# Patient Record
Sex: Female | Born: 1980 | Hispanic: No | Marital: Married | Smoking: Former smoker
Health system: Southern US, Community
[De-identification: ages and names within clinical notes are randomized; demographics above are authoritative.]

## PROBLEM LIST (undated history)

## (undated) ENCOUNTER — Inpatient Hospital Stay (HOSPITAL_COMMUNITY): Payer: Self-pay

## (undated) DIAGNOSIS — M5442 Lumbago with sciatica, left side: Secondary | ICD-10-CM

## (undated) DIAGNOSIS — B999 Unspecified infectious disease: Secondary | ICD-10-CM

## (undated) DIAGNOSIS — O139 Gestational [pregnancy-induced] hypertension without significant proteinuria, unspecified trimester: Secondary | ICD-10-CM

## (undated) DIAGNOSIS — M5441 Lumbago with sciatica, right side: Secondary | ICD-10-CM

## (undated) DIAGNOSIS — K219 Gastro-esophageal reflux disease without esophagitis: Secondary | ICD-10-CM

## (undated) DIAGNOSIS — G8929 Other chronic pain: Secondary | ICD-10-CM

## (undated) DIAGNOSIS — M797 Fibromyalgia: Secondary | ICD-10-CM

## (undated) DIAGNOSIS — R51 Headache: Secondary | ICD-10-CM

## (undated) DIAGNOSIS — O009 Unspecified ectopic pregnancy without intrauterine pregnancy: Secondary | ICD-10-CM

## (undated) DIAGNOSIS — K30 Functional dyspepsia: Secondary | ICD-10-CM

## (undated) HISTORY — DX: Other chronic pain: G89.29

## (undated) HISTORY — DX: Lumbago with sciatica, right side: M54.41

## (undated) HISTORY — DX: Lumbago with sciatica, left side: M54.42

## (undated) HISTORY — PX: CARDIAC CATHETERIZATION: SHX172

## (undated) HISTORY — PX: THERAPEUTIC ABORTION: SHX798

---
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
## 1999-02-27 ENCOUNTER — Inpatient Hospital Stay (HOSPITAL_COMMUNITY): Admission: AD | Admit: 1999-02-27 | Discharge: 1999-02-27 | Payer: Self-pay | Admitting: Obstetrics

## 1999-02-28 ENCOUNTER — Inpatient Hospital Stay (HOSPITAL_COMMUNITY): Admission: AD | Admit: 1999-02-28 | Discharge: 1999-03-05 | Payer: Self-pay | Admitting: Obstetrics

## 2000-11-02 ENCOUNTER — Encounter: Payer: Self-pay | Admitting: Emergency Medicine

## 2000-11-02 ENCOUNTER — Emergency Department (HOSPITAL_COMMUNITY): Admission: EM | Admit: 2000-11-02 | Discharge: 2000-11-02 | Payer: Self-pay | Admitting: Emergency Medicine

## 2001-01-23 ENCOUNTER — Other Ambulatory Visit: Admission: RE | Admit: 2001-01-23 | Discharge: 2001-01-23 | Payer: Self-pay | Admitting: Obstetrics and Gynecology

## 2001-03-01 ENCOUNTER — Inpatient Hospital Stay (HOSPITAL_COMMUNITY): Admission: AD | Admit: 2001-03-01 | Discharge: 2001-03-01 | Payer: Self-pay | Admitting: Obstetrics and Gynecology

## 2001-03-01 ENCOUNTER — Encounter: Payer: Self-pay | Admitting: Obstetrics and Gynecology

## 2001-08-20 ENCOUNTER — Inpatient Hospital Stay (HOSPITAL_COMMUNITY): Admission: AD | Admit: 2001-08-20 | Discharge: 2001-08-20 | Payer: Self-pay | Admitting: Obstetrics and Gynecology

## 2001-08-21 ENCOUNTER — Inpatient Hospital Stay (HOSPITAL_COMMUNITY): Admission: AD | Admit: 2001-08-21 | Discharge: 2001-08-23 | Payer: Self-pay | Admitting: Obstetrics and Gynecology

## 2002-08-15 ENCOUNTER — Emergency Department (HOSPITAL_COMMUNITY): Admission: EM | Admit: 2002-08-15 | Discharge: 2002-08-15 | Payer: Self-pay

## 2002-12-08 ENCOUNTER — Emergency Department (HOSPITAL_COMMUNITY): Admission: EM | Admit: 2002-12-08 | Discharge: 2002-12-08 | Payer: Self-pay | Admitting: Emergency Medicine

## 2003-04-04 ENCOUNTER — Emergency Department (HOSPITAL_COMMUNITY): Admission: EM | Admit: 2003-04-04 | Discharge: 2003-04-04 | Payer: Self-pay | Admitting: Emergency Medicine

## 2004-01-24 ENCOUNTER — Emergency Department (HOSPITAL_COMMUNITY): Admission: EM | Admit: 2004-01-24 | Discharge: 2004-01-25 | Payer: Self-pay | Admitting: Emergency Medicine

## 2004-05-03 HISTORY — PX: CARDIAC CATHETERIZATION: SHX172

## 2004-05-28 ENCOUNTER — Ambulatory Visit: Admission: RE | Admit: 2004-05-28 | Discharge: 2004-05-28 | Payer: Self-pay | Admitting: Internal Medicine

## 2004-06-26 ENCOUNTER — Emergency Department (HOSPITAL_COMMUNITY): Admission: EM | Admit: 2004-06-26 | Discharge: 2004-06-26 | Payer: Self-pay | Admitting: Emergency Medicine

## 2004-08-06 ENCOUNTER — Ambulatory Visit (HOSPITAL_COMMUNITY): Admission: RE | Admit: 2004-08-06 | Discharge: 2004-08-06 | Payer: Self-pay | Admitting: Cardiovascular Disease

## 2004-08-20 ENCOUNTER — Encounter: Admission: RE | Admit: 2004-08-20 | Discharge: 2004-08-20 | Payer: Self-pay | Admitting: Gastroenterology

## 2004-10-26 ENCOUNTER — Emergency Department (HOSPITAL_COMMUNITY): Admission: EM | Admit: 2004-10-26 | Discharge: 2004-10-26 | Payer: Self-pay | Admitting: Emergency Medicine

## 2004-11-19 ENCOUNTER — Ambulatory Visit (HOSPITAL_COMMUNITY): Admission: RE | Admit: 2004-11-19 | Discharge: 2004-11-19 | Payer: Self-pay | Admitting: Gastroenterology

## 2006-02-11 ENCOUNTER — Emergency Department (HOSPITAL_COMMUNITY): Admission: EM | Admit: 2006-02-11 | Discharge: 2006-02-11 | Payer: Self-pay | Admitting: Family Medicine

## 2006-05-09 ENCOUNTER — Emergency Department (HOSPITAL_COMMUNITY): Admission: EM | Admit: 2006-05-09 | Discharge: 2006-05-09 | Payer: Self-pay | Admitting: Emergency Medicine

## 2006-07-04 ENCOUNTER — Inpatient Hospital Stay (HOSPITAL_COMMUNITY): Admission: AD | Admit: 2006-07-04 | Discharge: 2006-07-04 | Payer: Self-pay | Admitting: Family Medicine

## 2006-08-30 ENCOUNTER — Ambulatory Visit: Payer: Self-pay | Admitting: Family Medicine

## 2006-09-02 ENCOUNTER — Ambulatory Visit (HOSPITAL_COMMUNITY): Admission: RE | Admit: 2006-09-02 | Discharge: 2006-09-02 | Payer: Self-pay | Admitting: Family Medicine

## 2006-09-06 ENCOUNTER — Ambulatory Visit: Payer: Self-pay | Admitting: *Deleted

## 2006-09-12 ENCOUNTER — Ambulatory Visit: Payer: Self-pay | Admitting: Family Medicine

## 2006-09-14 ENCOUNTER — Ambulatory Visit: Admission: RE | Admit: 2006-09-14 | Discharge: 2006-09-14 | Payer: Self-pay | Admitting: Family Medicine

## 2007-03-06 ENCOUNTER — Ambulatory Visit (HOSPITAL_COMMUNITY): Admission: RE | Admit: 2007-03-06 | Discharge: 2007-03-06 | Payer: Self-pay | Admitting: Obstetrics and Gynecology

## 2007-03-22 ENCOUNTER — Ambulatory Visit (HOSPITAL_COMMUNITY): Admission: RE | Admit: 2007-03-22 | Discharge: 2007-03-22 | Payer: Self-pay | Admitting: Internal Medicine

## 2007-03-26 ENCOUNTER — Encounter: Admission: RE | Admit: 2007-03-26 | Discharge: 2007-03-26 | Payer: Self-pay | Admitting: Internal Medicine

## 2007-09-20 ENCOUNTER — Emergency Department (HOSPITAL_COMMUNITY): Admission: EM | Admit: 2007-09-20 | Discharge: 2007-09-20 | Payer: Self-pay | Admitting: Emergency Medicine

## 2007-12-16 ENCOUNTER — Inpatient Hospital Stay (HOSPITAL_COMMUNITY): Admission: AD | Admit: 2007-12-16 | Discharge: 2007-12-16 | Payer: Self-pay | Admitting: Family Medicine

## 2007-12-18 ENCOUNTER — Inpatient Hospital Stay (HOSPITAL_COMMUNITY): Admission: AD | Admit: 2007-12-18 | Discharge: 2007-12-18 | Payer: Self-pay | Admitting: Obstetrics and Gynecology

## 2007-12-22 ENCOUNTER — Inpatient Hospital Stay (HOSPITAL_COMMUNITY): Admission: AD | Admit: 2007-12-22 | Discharge: 2007-12-22 | Payer: Self-pay | Admitting: Obstetrics and Gynecology

## 2007-12-25 ENCOUNTER — Inpatient Hospital Stay (HOSPITAL_COMMUNITY): Admission: AD | Admit: 2007-12-25 | Discharge: 2007-12-25 | Payer: Self-pay | Admitting: Obstetrics and Gynecology

## 2007-12-28 ENCOUNTER — Inpatient Hospital Stay (HOSPITAL_COMMUNITY): Admission: AD | Admit: 2007-12-28 | Discharge: 2007-12-28 | Payer: Self-pay | Admitting: Obstetrics and Gynecology

## 2007-12-31 ENCOUNTER — Inpatient Hospital Stay (HOSPITAL_COMMUNITY): Admission: AD | Admit: 2007-12-31 | Discharge: 2007-12-31 | Payer: Self-pay | Admitting: Obstetrics and Gynecology

## 2008-01-07 ENCOUNTER — Inpatient Hospital Stay (HOSPITAL_COMMUNITY): Admission: AD | Admit: 2008-01-07 | Discharge: 2008-01-07 | Payer: Self-pay | Admitting: Obstetrics and Gynecology

## 2008-01-21 ENCOUNTER — Inpatient Hospital Stay (HOSPITAL_COMMUNITY): Admission: AD | Admit: 2008-01-21 | Discharge: 2008-01-21 | Payer: Self-pay | Admitting: Obstetrics and Gynecology

## 2008-03-30 ENCOUNTER — Emergency Department (HOSPITAL_COMMUNITY): Admission: EM | Admit: 2008-03-30 | Discharge: 2008-03-31 | Payer: Self-pay | Admitting: Emergency Medicine

## 2008-05-06 ENCOUNTER — Emergency Department (HOSPITAL_COMMUNITY): Admission: EM | Admit: 2008-05-06 | Discharge: 2008-05-06 | Payer: Self-pay | Admitting: Family Medicine

## 2008-07-06 ENCOUNTER — Emergency Department (HOSPITAL_COMMUNITY): Admission: EM | Admit: 2008-07-06 | Discharge: 2008-07-06 | Payer: Self-pay | Admitting: Emergency Medicine

## 2009-03-17 ENCOUNTER — Emergency Department (HOSPITAL_COMMUNITY): Admission: EM | Admit: 2009-03-17 | Discharge: 2009-03-18 | Payer: Self-pay | Admitting: Emergency Medicine

## 2009-06-21 ENCOUNTER — Emergency Department (HOSPITAL_COMMUNITY): Admission: EM | Admit: 2009-06-21 | Discharge: 2009-06-21 | Payer: Self-pay | Admitting: Emergency Medicine

## 2009-11-04 ENCOUNTER — Emergency Department (HOSPITAL_COMMUNITY): Admission: EM | Admit: 2009-11-04 | Discharge: 2009-11-04 | Payer: Self-pay | Admitting: Emergency Medicine

## 2010-03-29 ENCOUNTER — Emergency Department (HOSPITAL_COMMUNITY): Admission: EM | Admit: 2010-03-29 | Discharge: 2010-03-29 | Payer: Self-pay | Admitting: Family Medicine

## 2010-08-05 LAB — RAPID STREP SCREEN (MED CTR MEBANE ONLY): Streptococcus, Group A Screen (Direct): NEGATIVE

## 2010-08-13 LAB — RAPID STREP SCREEN (MED CTR MEBANE ONLY): Streptococcus, Group A Screen (Direct): POSITIVE — AB

## 2010-08-17 LAB — POCT URINALYSIS DIP (DEVICE)
Bilirubin Urine: NEGATIVE
Hgb urine dipstick: NEGATIVE
pH: 5.5 (ref 5.0–8.0)

## 2010-09-18 NOTE — H&P (Signed)
[facility]  Patient:    [name], [name] Visit Number: [id number] MRN: [id number]          Service Type: OBS Location: [facility] Attending Physician:  Esmeralda Arthur Dictated by:   Saverio Danker, C.N.M. Admit Date:  08/20/2001                           History and Physical  HISTORY OF PRESENT ILLNESS:   Ms. [name] is a 30 year old single Native-American female, gravida 3, para 1-0-1-1, at 41-1/7ths weeks who presents for her routine visit and NST today.  On the NST, she was noted to have one variable deceleration down to approximately 60 beats per minute that recovered spontaneously and the rest of her tracing was reassuring.  She reports occasional uterine contractions but nothing regular.  She denies any leaking or vaginal bleeding.  She reports that she was seen at [facility] yesterday and was about 1 to 2 cm at that time.  She denies any headache, visual disturbances, or nausea or vomiting.  Her pregnancy has been followed at [facility] by the certified nurse midwife service and has been essentially uncomplicated though at risk for history of preeclampsia with her previous pregnancy and a history of blood transfusions required following that pregnancy, history of previous cesarean section desiring a vaginal birth.  Her previous cesarean section was low transverse and she would like to have a vaginal birth attempt if possible.  GENERAL MEDICAL HISTORY:      She has no known drug allergies.  She reports having had the usual childhood diseases.  She reports a history of blood transfusions in the past, a history of occasional urinary tract infections and her only surgery was the C section in 2000.  FAMILY HISTORY:               Significant for maternal grandmother, maternal grandfather with MI and hypertension, maternal grandmother with thrombophlebitis.  Maternal grandmother and aunt with type 2 diabetes, maternal great  aunt with breast cancer.  GENETIC HISTORY:              Negative.  SOCIAL HISTORY:               She is single.  The father of the baby is involved and supportive.  His name is Lus Kriegel.  She is unemployed.  He is a Naval architect.  They deny any illicit drug use, alcohol, or smoking with this pregnancy.  PRENATAL LABS:                 Her blood type is O positive.  Her antibody screen is negative.  Sickle cell trait is negative.  Syphilis is nonreactive. Rubella is positive.  Hepatitis B surface antigen is negative.  HIV is nonreactive.  GC and Chlamydia are both negative.  Pap is within normal limits.  Her one-hour glucola is 91, the maternal serum atrial fibrillation was within normal range, and her beta strep was negative.  PHYSICAL EXAMINATION:  VITAL SIGNS:                  Stable.  She is afebrile.  HEENT:                        Grossly within normal limits.  HEART:  Regular rate and rhythm.  CHEST:                        Clear.  BREASTS:                      Soft and nontender.  ABDOMEN:                      Gravid with uterine contractions that are irregular and mild.  Her fetal heart rate is in the 140s to 150s with accelerations and one significant variable in a 20-minute tracing.  Her cervix is 3 to 4 cm, 90%, posterior, vertex, and -2, and her extremities are within normal limits.  ASSESSMENT:                   1. Intrauterine pregnancy at 41-1/7ths weeks.                               2. Early labor.                               3. Negative group B strep.                               4. Variable fetal heart rate deceleration noted                                  on NST.  PLAN:                         Admit to [facility] for early labor/augmentation of labor as needed secondary to being postdates and having variable fetal heart rate decelerations.  Wynelle Bourgeois, CNM, has been notified of the patients admission and will follow  patient. Dictated by:   Vance Gather Duplantis, C.N.M. Attending Physician:  Esmeralda Arthur DD:  08/21/01 TD:  08/21/01 Job: [id number]

## 2010-09-18 NOTE — Op Note (Signed)
NAME:  [name], [name]            ACCOUNT NO.:  [id number]   MEDICAL RECORD NO.:  [id number]          PATIENT TYPE:  AMB   LOCATION:  ENDO                         FACILITY:  [facility]   PHYSICIAN:  James L. Malon Kindle., M.D.DATE OF BIRTH:  1980/07/26   DATE OF PROCEDURE:  11/19/2004  DATE OF DISCHARGE:  11/19/2004                                 OPERATIVE REPORT   PROCEDURE:  Esophageal manometry.   INDICATIONS:  Chest pain, possibly due to reflux.   DESCRIPTION OF PROCEDURE:  The procedure was performed in the [facility]  manometry lab in the usual manner.  No provocative studies were obtained.  The results are as follows:  The five-channel Express apparatus was used.   1.  Upper esophageal sphincter:  It relaxed with swallowing.  2.  Esophageal body:  Peristalsis was normal in the wet swallows presented      with a mean pressure in the distal esophagus of 101 and a mean duration      of 3.7 seconds.  There was no obvious esophageal spasm.  3.  LES was seen very poorly in the tracings presented to me, but the      pressure was quite low.  It was measured at 7.5 mm, which is quite low,      with not much in the way of relaxation.   ASSESSMENT:  Hypotensive lower esophageal sphincter, otherwise normal  manometry.   PLAN:  Will proceed with a 24-hour pH probe at this time.       JLE/MEDQ  D:  11/26/2004  T:  11/26/2004  Job:  [id number]   cc:   Gerlene Burdock A. Alanda Amass, M.D.  [phone] [address]  [place]  [place] [address]  Fax: [phone]

## 2010-09-18 NOTE — Cardiovascular Report (Signed)
[name], [name]            ACCOUNT NO.:  [id number]   MEDICAL RECORD NO.:  [id number]          PATIENT TYPE:  OIB   LOCATION:  2899                         FACILITY:  [facility]   PHYSICIAN:  Richard A. Alanda Amass, M.D.DATE OF BIRTH:  1981/02/18   DATE OF PROCEDURE:  08/06/2004  DATE OF DISCHARGE:                              CARDIAC CATHETERIZATION   PROCEDURE:  1.  Retrograde central aortic catheterization.  2.  Selective coronary angiography by Judkins technique.  3.  Left ventricular angiogram RAO/LAO projection.  4.  Abdominal angiogram hand injection mid stream PA projection.   PROCEDURE:  Patient was brought to the second floor CP Laboratory in a  postabsorptive state after 5 mg Valium p.o. pre medication.  She was  hydrated preoperatively with normal laboratories with same day admission.  The right groin was prepped, draped in the usual manner.  1% Xylocaine was  used for local anesthesia and right CFA was entered with single anterior  puncture using an 18 thin wall needle.  A 5-French short sidearm sheath was  inserted without difficulty and 5-French 4 cm taper Scimed preformed  coronary pigtail catheters were used for selective coronary angiography and  LV angiogram in the RAO and LAO projection at 25 mL 14 mL per second, 20 mL  12 mL per second, respectively.  Pullback pressure of the CA was performed,  showed no gradient across the aortic valve.  Hand injection at the abdominal  aorta above the level of the renal arteries was done demonstrating single  normal patent renal arteries bilaterally with no stenosis and no evidence of  FMD (fibromuscular dysplasia).  The infrarenal abdominal aorta was normal  appearing on limited injection.   The patient tolerated diagnostic procedure well.  Catheter was removed.  Sidearm sheath was flushed.  The patient was transferred to the holding area  for sheath removal and pressure hemostasis in stable condition.  She  received 1 mg  of Versed for sedation at beginning of procedure IV.   PRESSURES:  LV:  115/0; LVEDP 14 mmHg.   CA:  115/70 mmHg.   There was no gradient across the aortic valve on catheter pullback.   Fluoroscopy did not reveal any intracoronary, intracardiac or valvular  calcification.   The main left coronary was normal.   The LAD was widely patent, smooth, and normal throughout its course.  It  bifurcated at the apex.   The first diagonal was a large vessel that arose before ectopy 1, it was  widely patent and normal.   The second diagonal rose before SP2 and SP3 junction.  The mid third of the  LAD bifurcated, was a moderate size and normal.  The third diagonal rose  from the mid LAD, was small and normal.   The circumflex was nondominant, moderate size, comprised of the large  bifurcating marginal branch and PAVD branch that were widely patent, smooth,  and normal.   The dominant right coronary artery was normal throughout its course with a  normal PDA and PLA distally, normal RV branch in the mid portion and atrial  conus branch proximally.   There  was no evidence of atherosclerotic disease angiographically and there  was no coronary spasm present.   LV angiogram demonstrates a normally contracting ventricle with EF  approximately 60%, no segmental wall motion abnormality.  No mitral  regurgitation or MVP present.   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  Smokes  half a pack a day and no significant history of ETOH or drug abuse.  She has  a chronic chest pain syndrome and has seen multiple physicians.  She has  been diagnosed with possible fibromyalgia but there are no migraine  headaches.  She has had a normal 2-D echocardiogram.  Serologies have been  negative for collagen vascular disease under the care of Dr. Phylliss Bob.  She has  had trials of PPI, although she does take  nonsteroidals for chest  discomfort.  She has had several emergency room visits and frequent office  calls and visits for chest pain.  A Cardiolite showed possible mild stress  induced ischemia in the left anterior descending distribution prompting  recommendation for diagnostic catheterization in this setting.  Fortunately,  the patient has normal coronary arteries and left ventricle.  There is no  evidence of coronary spasm on this study.  There is no FMV of the renal  arteries.  I would recommend reassurance, trial of high dose PPI and  probably at this stage gastrointestinal referral.  Discontinuation of  smoking and discontinuation of nonsteroidal agents.   Gastrointestinal consultation and possible endoscopy appear warranted.   CATHETERIZATION DIAGNOSES:  1.  Chest pain, etiology not determined.  2.  Borderline Cardiolite, false borderline positive July 23, 2004 probably      related to diaphragmatic and breast attenuation.  3.  Probable gastroesophageal reflux disease, possible esophageal spasm.  4.  Possible fibromyalgia.  Negative work-up for collagen vascular      disease.  5.  Anxiety.      RAW/MEDQ  D:  08/06/2004  T:  08/06/2004  Job:  [id number]   cc:   Della Goo, M.D.  [address] [place]  [place] [address]  Fax: [phone]   CP Lab   Areatha Keas, M.D.  [address]  [address]  [place]  [place] [address]  Fax: [phone]

## 2010-09-18 NOTE — Op Note (Signed)
NAME:  [name], [name]            ACCOUNT NO.:  [id number]   MEDICAL RECORD NO.:  [id number]          PATIENT TYPE:  AMB   LOCATION:  ENDO                         FACILITY:  [facility]   PHYSICIAN:  James L. Malon Kindle., M.D.DATE OF BIRTH:  01/16/1981   DATE OF PROCEDURE:  11/19/2004  DATE OF DISCHARGE:  11/19/2004                                 OPERATIVE REPORT   PROCEDURE:  Twenty-four-hour pH probe.   ATTENDING:  Llana Aliment. Randa Evens, M.D.   INDICATION:  This is done to evaluate for the possibility of reflux.   DESCRIPTION OF THE PROCEDURE:  The patient had a manometry performed and  following this, a 24-hour probe was placed.  The study duration was 23 hours  and 47 minutes.   RESULTS:  The results are as follows:  Distal channel and proximal channel  were measured.  The distal channel results are:  #1 - Upright time and  reflux 1.5%, normal less than 6.3.  #2 - Recumbent time and reflux 6.3%,  normal less than 1.2.  #3 - Total time was 2.2 with normal less than 4.2.  #4 - Longest episode 4.8 minutes and total episode was 49 with normal less  than 50.   ASSESSMENT:  Composite score is 20.7 with normal being less than 22.  This  shows some reflux, but within the normal limits.  I doubt that esophageal  reflux is the entire source of her chest pain.   PLAN:  We will see the patient back in the office and will discussed these  with her further.       JLE/MEDQ  D:  11/26/2004  T:  11/26/2004  Job:  [id number]   cc:   Gerlene Burdock A. Alanda Amass, M.D.  [phone] [address]  [place]  [place] [address]  Fax: [phone]

## 2010-09-18 NOTE — Discharge Summary (Signed)
[facility]  Patient:    [name], [name] Visit Number: [id number] MRN: [id number]          Service Type: OBS Location: [facility] Attending Physician:  Esmeralda Arthur Dictated by:   Mack [place], C.N.M. Admit Date:  08/20/2001 Discharge Date: 08/20/2001                             Discharge Summary  ADMISSION DIAGNOSES:          1. Intrauterine pregnancy at 41-1/2 weeks.                               2. Early labor.                               3. Desires trial of labor.  PROCEDURES:                   1. Vaginal birth after cesarean section.                               2. Repair of left labial and midline laceration.  DISCHARGE DIAGNOSES:          1. Intrauterine pregnancy at 41-1/2 weeks.                               2. Early labor.                               3. Desires trial of labor.                               4. Vaginal birth after cesarean section.  HOSPITAL COURSE:              The patient has done well in the immediate postpartum period.  Her hemoglobin on the first postpartum day is 9.0.  Her vital signs have been stable.  Her bleeding has been normal.  On her second postpartum day she is judged to be in satisfactory condition for discharge.  DISCHARGE INSTRUCTIONS:       Per [facility] hand out.  DISCHARGE MEDICATIONS:        1. Motrin 600 mg p.o. q.6h. p.r.n. pain.                               2. Prenatal vitamins.  DISCHARGE FOLLOW UP:          Will be at [facility] in six weeks. Dictated by:   Mack [place], C.N.M. Attending Physician:  Esmeralda Arthur DD:  08/23/01 TD:  08/23/01 Job: [id number]

## 2010-11-20 ENCOUNTER — Emergency Department (HOSPITAL_COMMUNITY)
Admission: EM | Admit: 2010-11-20 | Discharge: 2010-11-21 | Disposition: A | Discharge status: Home / Self Care | Payer: No Typology Code available for payment source | Attending: Emergency Medicine | Admitting: Emergency Medicine

## 2010-11-20 DIAGNOSIS — M549 Dorsalgia, unspecified: Secondary | ICD-10-CM | POA: Insufficient documentation

## 2010-11-20 DIAGNOSIS — Y92410 Unspecified street and highway as the place of occurrence of the external cause: Secondary | ICD-10-CM | POA: Insufficient documentation

## 2010-11-20 DIAGNOSIS — T1490XA Injury, unspecified, initial encounter: Secondary | ICD-10-CM | POA: Insufficient documentation

## 2010-11-20 DIAGNOSIS — R51 Headache: Secondary | ICD-10-CM | POA: Insufficient documentation

## 2011-02-01 LAB — HCG, QUANTITATIVE, PREGNANCY: hCG, Beta Chain, Quant, S: <2

## 2011-02-02 LAB — URINE CULTURE: Culture: NO GROWTH

## 2011-02-02 LAB — BASIC METABOLIC PANEL
BUN: 8
CO2: 22
Creatinine, Ser: 0.71
Glucose, Bld: 92
Potassium: 3.5

## 2011-02-02 LAB — URINALYSIS, ROUTINE W REFLEX MICROSCOPIC
Bilirubin Urine: NEGATIVE
Glucose, UA: NEGATIVE
Ketones, ur: NEGATIVE
Nitrite: NEGATIVE
Protein, ur: NEGATIVE
pH: 6

## 2011-02-02 LAB — CBC
Hemoglobin: 12.7
MCV: 87.4
Platelets: 222
RDW: 13.1

## 2011-02-02 LAB — POCT URINALYSIS DIP (DEVICE)
Ketones, ur: NEGATIVE
Urobilinogen, UA: 0.2

## 2011-02-02 LAB — WET PREP, GENITAL
Clue Cells Wet Prep HPF POC: NONE SEEN
Yeast Wet Prep HPF POC: NONE SEEN

## 2011-02-02 LAB — DIFFERENTIAL
Basophils Absolute: 0
Eosinophils Absolute: 0.1
Eosinophils Relative: 1
Lymphs Abs: 2.9

## 2011-02-03 LAB — HCG, QUANTITATIVE, PREGNANCY: hCG, Beta Chain, Quant, S: 27 — ABNORMAL HIGH

## 2011-02-09 LAB — CBC
HCT: 39.3
Hemoglobin: 13.4
MCV: 87.8
RDW: 12.6
WBC: 8.5

## 2011-02-09 LAB — COMPREHENSIVE METABOLIC PANEL
Alkaline Phosphatase: 66
BUN: 8
Chloride: 106
Creatinine, Ser: 0.77
GFR calc non Af Amer: >60
Glucose, Bld: 102 — ABNORMAL HIGH
Potassium: 4.1
Total Bilirubin: 0.7

## 2011-02-09 LAB — TSH: TSH: 1.189

## 2011-06-02 ENCOUNTER — Emergency Department (HOSPITAL_COMMUNITY)
Admission: EM | Admit: 2011-06-02 | Discharge: 2011-06-02 | Disposition: A | Discharge status: Home / Self Care | Payer: BC Managed Care – PPO | Source: Home / Self Care | Attending: Emergency Medicine | Admitting: Emergency Medicine

## 2011-06-02 ENCOUNTER — Encounter (HOSPITAL_COMMUNITY): Payer: Self-pay | Admitting: *Deleted

## 2011-06-02 DIAGNOSIS — J029 Acute pharyngitis, unspecified: Secondary | ICD-10-CM

## 2011-06-02 DIAGNOSIS — J069 Acute upper respiratory infection, unspecified: Secondary | ICD-10-CM

## 2011-06-02 MED ORDER — SODIUM CHLORIDE 0.65 % NA SOLN: 1.0000 | NASAL | Status: DC | PRN

## 2011-06-02 MED ORDER — DIPHENHYDRAMINE HCL 25 MG PO TABS: 25.0000 mg | ORAL_TABLET | Freq: Every evening | ORAL | Status: DC | PRN

## 2011-06-02 MED ORDER — GUAIFENESIN-CODEINE 100-10 MG/5ML PO SYRP: 5.0000 mL | ORAL_SOLUTION | Freq: Three times a day (TID) | ORAL | Status: AC | PRN

## 2011-06-02 NOTE — ED Provider Notes (Signed)
History     CSN: [id number]  Arrival date & time 06/02/11  1004   First MD Initiated Contact with Patient 06/02/11 1024      Chief Complaint  Patient presents with  . Cough  . Sore Throat    (Consider location/radiation/quality/duration/timing/severity/associated sxs/prior treatment) HPI Comments: Cough and congestion for 3 days with a sore throat and coughing, both eras hurt  Patient is a 31 y.o. female presenting with cough and pharyngitis. The history is provided by the patient.  Cough This is a new problem. The problem occurs constantly. The cough is non-productive. The maximum temperature recorded prior to her arrival was 100 to 100.9 F. Associated symptoms include chills, ear pain and rhinorrhea. Pertinent negatives include no chest pain, no ear congestion, no myalgias and no shortness of breath.  Sore Throat Pertinent negatives include no chest pain and no shortness of breath.    History reviewed. No pertinent past medical history.  Past Surgical History  Procedure Date  . Cesarean section     No family history on file.  History  Substance Use Topics  . Smoking status: Never Smoker   . Smokeless tobacco: Not on file  . Alcohol Use: No    OB History    Grav Para Term Preterm Abortions TAB SAB Ect Mult Living                  Review of Systems  Constitutional: Positive for fever, chills, activity change and appetite change.  HENT: Positive for ear pain, congestion and rhinorrhea.   Respiratory: Positive for cough. Negative for chest tightness and shortness of breath.   Cardiovascular: Negative for chest pain and palpitations.  Musculoskeletal: Negative for myalgias.    Allergies  Latex  Home Medications   Current Outpatient Rx  Name Route Sig Dispense Refill  . DIPHENHYDRAMINE HCL 25 MG PO TABS Oral Take 1 tablet (25 mg total) by mouth at bedtime as needed for itching. 10 tablet 0  . GUAIFENESIN-CODEINE 100-10 MG/5ML PO SYRP Oral Take 5 mLs by mouth  3 (three) times daily as needed for cough. 120 mL 0  . SODIUM CHLORIDE 0.65 % NA SOLN Nasal Place 1 spray into the nose as needed for congestion. 15 mL 12    BP 132/82  Pulse 111  Temp(Src) 100.1 F (37.8 C) (Oral)  Resp 20  SpO2 96%  LMP 05/04/2011  Physical Exam  Nursing note and vitals reviewed. Constitutional: She appears well-nourished. No distress.  HENT:  Head: Normocephalic.  Mouth/Throat: No oropharyngeal exudate.  Eyes: Conjunctivae are normal. Right eye exhibits no discharge. Left eye exhibits no discharge. No scleral icterus.  Neck: Neck supple. No JVD present.  Cardiovascular: Regular rhythm.   No extrasystoles are present. Tachycardia present.   No murmur heard. Pulmonary/Chest: Effort normal and breath sounds normal. No respiratory distress. She has no decreased breath sounds. She has no wheezes.  Lymphadenopathy:    She has no cervical adenopathy.    ED Course  Procedures (including critical care time)  Labs Reviewed - No data to display No results found.   1. Upper respiratory infection   2. Pharyngitis       MDM  URI with pharyngitis, x 3 days. Normal lung exam, comfortable, no distress low grade temperature with      + sick contacts        Jimmie Molly, MD 06/02/11 1611

## 2011-06-02 NOTE — ED Notes (Signed)
Pt c/o sorethroat onset Monday with nasal congestion, ears hurting (left worse than right) and cough.  States husband had the flu last week, but she states her sx seem to be different.  Also with bodyaches and fever of 100.

## 2011-07-17 ENCOUNTER — Encounter (HOSPITAL_COMMUNITY): Payer: Self-pay | Admitting: *Deleted

## 2011-07-17 ENCOUNTER — Emergency Department (HOSPITAL_COMMUNITY)
Admission: EM | Admit: 2011-07-17 | Discharge: 2011-07-17 | Disposition: A | Discharge status: Home / Self Care | Payer: BC Managed Care – PPO | Attending: Emergency Medicine | Admitting: Emergency Medicine

## 2011-07-17 ENCOUNTER — Emergency Department (HOSPITAL_COMMUNITY): Payer: BC Managed Care – PPO

## 2011-07-17 ENCOUNTER — Other Ambulatory Visit: Payer: Self-pay

## 2011-07-17 DIAGNOSIS — K219 Gastro-esophageal reflux disease without esophagitis: Secondary | ICD-10-CM | POA: Insufficient documentation

## 2011-07-17 DIAGNOSIS — R0789 Other chest pain: Secondary | ICD-10-CM | POA: Insufficient documentation

## 2011-07-17 HISTORY — DX: Gastro-esophageal reflux disease without esophagitis: K21.9

## 2011-07-17 HISTORY — DX: Functional dyspepsia: K30

## 2011-07-17 LAB — PREGNANCY, URINE: Preg Test, Ur: NEGATIVE

## 2011-07-17 MED ORDER — PANTOPRAZOLE SODIUM 40 MG PO TBEC: 40.0000 mg | DELAYED_RELEASE_TABLET | Freq: Every day | ORAL | Status: DC

## 2011-07-17 MED ORDER — PANTOPRAZOLE SODIUM 40 MG PO TBEC
40.0000 mg | DELAYED_RELEASE_TABLET | Freq: Once | ORAL | Status: AC
  Administered 2011-07-17: 40 mg via ORAL
  Filled 2011-07-17: qty 1

## 2011-07-17 MED ORDER — GI COCKTAIL ~~LOC~~
30.0000 mL | Freq: Once | ORAL | Status: AC
  Administered 2011-07-17: 30 mL via ORAL
  Filled 2011-07-17: qty 30

## 2011-07-17 NOTE — ED Notes (Signed)
Patient c/o intermittent epigastric pain, worsens with food/fluid intake, that began yesterday morning accompanied with some nausea. Patient denies emesis. Reports it feels like indigestion. Patient also reports LMP was 06/04/2011 and is trying to get pregnant.

## 2011-07-17 NOTE — ED Notes (Signed)
Pt states that she began having central chest pain this morning upon waking.  Pt states that the pain radiates to her back and is intermittent.  The pain is described as sharp and it feels like indigestion.  Pt confirms hx of same, pt states that she had indigestion at that time.

## 2011-07-17 NOTE — ED Notes (Signed)
Pt stating that her menstrual period is late, she is sexually active, and does not use any form of birth control.

## 2011-07-17 NOTE — ED Provider Notes (Addendum)
History     CSN: [id number]  Arrival date & time 07/17/11  0128   First MD Initiated Contact with Patient 07/17/11 0248      Chief Complaint  Patient presents with  . Chest Pain    (Consider location/radiation/quality/duration/timing/severity/associated sxs/prior treatment) HPI Patient is a 31 yo F who presents today complaining of 10/10 substernal chest pain characterized as burning without radiation.  This is constant, began today, and feels like her indigestion.  She has been treated in the past for this with protonix but has been off of it for some time.  Patient has no other complaints today.  She can think of no alleviating or exacerbating factors.There are no other associated or modifying factors.  Past Medical History  Diagnosis Date  . GERD (gastroesophageal reflux disease)   . Indigestion     Past Surgical History  Procedure Date  . Cesarean section   . Cardiac catheterization     No family history on file.  History  Substance Use Topics  . Smoking status: Never Smoker   . Smokeless tobacco: Not on file  . Alcohol Use: No    OB History    Grav Para Term Preterm Abortions TAB SAB Ect Mult Living                  Review of Systems  Constitutional: Negative.   HENT: Negative.   Eyes: Negative.   Respiratory: Negative.   Cardiovascular: Positive for chest pain.  Gastrointestinal: Negative.   Genitourinary: Negative.   Musculoskeletal: Negative.   Skin: Negative.   Neurological: Negative.   Hematological: Negative.   Psychiatric/Behavioral: Negative.   All other systems reviewed and are negative.    Allergies  Latex  Home Medications   Current Outpatient Rx  Name Route Sig Dispense Refill  . CETIRIZINE HCL 10 MG PO TABS Oral Take 10 mg by mouth daily.    Marland Kitchen PANTOPRAZOLE SODIUM 40 MG PO TBEC Oral Take 1 tablet (40 mg total) by mouth daily. 30 tablet 1    BP 117/70  Pulse 80  Temp(Src) 98.1 F (36.7 C) (Oral)  Resp 17  Ht 4\' 11"  (1.499  m)  Wt 200 lb (90.719 kg)  BMI 40.39 kg/m2  SpO2 99%  LMP 06/04/2011  Physical Exam  Nursing note and vitals reviewed. GEN: Well-developed, well-nourished female in no distress HEENT: Atraumatic, normocephalic. Oropharynx clear without erythema EYES: PERRLA BL, no scleral icterus. NECK: Trachea midline, no meningismus CV: regular rate and rhythm. No murmurs, rubs, or gallops PULM: No respiratory distress.  No crackles, wheezes, or rales. GI: soft, non-tender. No guarding, rebound, or tenderness. + bowel sounds  GU: deferred Neuro: cranial nerves 2-12 intact, no abnormalities of strength or sensation, A and O x 3 MSK: Patient moves all 4 extremities symmetrically, no deformity, edema, or injury noted Skin: No rashes petechiae, purpura, or jaundice Psych: no abnormality of mood    Date: 07/17/2011  Rate: 85  Rhythm: normal sinus rhythm  QRS Axis: normal  Intervals: normal  ST/T Wave abnormalities: normal  Conduction Disutrbances: none  Narrative Interpretation: unremarkable     ED Course  Procedures (including critical care time)   Labs Reviewed  PREGNANCY, URINE   Dg Chest Port 1 View  07/17/2011  *RADIOLOGY REPORT*  Clinical Data: Mid chest pain to back pain.  PORTABLE CHEST - 1 VIEW  Comparison: Chest radiograph performed 03/18/2009  Findings: The lungs are well-aerated and clear.  There is no evidence of focal opacification, pleural  effusion or pneumothorax.  The cardiomediastinal silhouette is within normal limits.  No acute osseous abnormalities are seen.  IMPRESSION: No acute cardiopulmonary process seen.  Original Report Authenticated By: Tonia Ghent, M.D.     1. GERD (gastroesophageal reflux disease)       MDM  Patient was evaluated and had ECG that was unremarkable.  CXR was also ordered and was negative.  Symptoms were improved with GI cocktail.  Patient was concerned that she could be pregnant but U preg was negative.  Patient was given protonix po and  a prescription for this.  She was pain free at discharge and discharged home in good condition.        Cyndra Numbers, MD 07/17/11 1651  Cyndra Numbers, MD 08/27/11 [phone]

## 2011-07-17 NOTE — Discharge Instructions (Signed)
Diet for GERD or PUD  Nutrition therapy can help ease the discomfort of gastroesophageal reflux disease (GERD) and peptic ulcer disease (PUD).   HOME CARE INSTRUCTIONS    Eat your meals slowly, in a relaxed setting.   Eat 5 to 6 small meals per day.   If a food causes distress, stop eating it for a period of time.  FOODS TO AVOID   Coffee, regular or decaffeinated.   Cola beverages, regular or low calorie.   Tea, regular or decaffeinated.   Pepper.   Cocoa.   High fat foods, including meats.   Butter, margarine, hydrogenated oil (trans fats).   Peppermint or spearmint (if you have GERD).   Fruits and vegetables if not tolerated.   Alcohol.   Nicotine (smoking or chewing). This is one of the most potent stimulants to acid production in the gastrointestinal tract.   Any food that seems to aggravate your condition.  If you have questions regarding your diet, ask your caregiver or a registered dietitian.  TIPS   Lying flat may make symptoms worse. Keep the head of your bed raised 6 to 9 inches (15 to 23 cm) by using a foam wedge or blocks under the legs of the bed.   Do not lay down until 3 hours after eating a meal.   Daily physical activity may help reduce symptoms.  MAKE SURE YOU:    Understand these instructions.   Will watch your condition.   Will get help right away if you are not doing well or get worse.  Document Released: 04/19/2005 Document Revised: 04/08/2011 Document Reviewed: 03/05/2011  ExitCare Patient Information 2012 [employer]    Gastroesophageal Reflux Disease, Adult  Gastroesophageal reflux disease (GERD) happens when acid from your stomach flows up into the esophagus. When acid comes in contact with the esophagus, the acid causes soreness (inflammation) in the esophagus. Over time, GERD may create small holes (ulcers) in the lining of the esophagus.  CAUSES    Increased body weight. This puts pressure on the stomach, making acid rise from the stomach into the  esophagus.   Smoking. This increases acid production in the stomach.   Drinking alcohol. This causes decreased pressure in the lower esophageal sphincter (valve or ring of muscle between the esophagus and stomach), allowing acid from the stomach into the esophagus.   Late evening meals and a full stomach. This increases pressure and acid production in the stomach.   A malformed lower esophageal sphincter.  Sometimes, no cause is found.  SYMPTOMS    Burning pain in the lower part of the mid-chest behind the breastbone and in the mid-stomach area. This may occur twice a week or more often.   Trouble swallowing.   Sore throat.   Dry cough.   Asthma-like symptoms including chest tightness, shortness of breath, or wheezing.  DIAGNOSIS   Your caregiver may be able to diagnose GERD based on your symptoms. In some cases, X-rays and other tests may be done to check for complications or to check the condition of your stomach and esophagus.  TREATMENT   Your caregiver may recommend over-the-counter or prescription medicines to help decrease acid production. Ask your caregiver before starting or adding any new medicines.   HOME CARE INSTRUCTIONS    Change the factors that you can control. Ask your caregiver for guidance concerning weight loss, quitting smoking, and alcohol consumption.   Avoid foods and drinks that make your symptoms worse, such as:   Caffeine or   alcoholic drinks.   Chocolate.   Peppermint or mint flavorings.   Garlic and onions.   Spicy foods.   Citrus fruits, such as oranges, lemons, or limes.   Tomato-based foods such as sauce, chili, salsa, and pizza.   Fried and fatty foods.   Avoid lying down for the 3 hours prior to your bedtime or prior to taking a nap.   Eat small, frequent meals instead of large meals.   Wear loose-fitting clothing. Do not wear anything tight around your waist that causes pressure on your stomach.   Raise the head of your bed 6 to 8 inches with wood blocks to  help you sleep. Extra pillows will not help.   Only take over-the-counter or prescription medicines for pain, discomfort, or fever as directed by your caregiver.   Do not take aspirin, ibuprofen, or other nonsteroidal anti-inflammatory drugs (NSAIDs).  SEEK IMMEDIATE MEDICAL CARE IF:    You have pain in your arms, neck, jaw, teeth, or back.   Your pain increases or changes in intensity or duration.   You develop nausea, vomiting, or sweating (diaphoresis).   You develop shortness of breath, or you faint.   Your vomit is green, yellow, black, or looks like coffee grounds or blood.   Your stool is red, bloody, or black.  These symptoms could be signs of other problems, such as heart disease, gastric bleeding, or esophageal bleeding.  MAKE SURE YOU:    Understand these instructions.   Will watch your condition.   Will get help right away if you are not doing well or get worse.  Document Released: 01/27/2005 Document Revised: 04/08/2011 Document Reviewed: 11/06/2010  Exit[employer] Patient Information 2012 [employer].

## 2012-03-05 ENCOUNTER — Inpatient Hospital Stay (HOSPITAL_COMMUNITY)
Admission: AD | Admit: 2012-03-05 | Discharge: 2012-03-05 | Disposition: A | Discharge status: Home / Self Care | Payer: BC Managed Care – PPO | Source: Ambulatory Visit | Attending: Obstetrics & Gynecology | Admitting: Obstetrics & Gynecology

## 2012-03-05 ENCOUNTER — Inpatient Hospital Stay (HOSPITAL_COMMUNITY): Payer: BC Managed Care – PPO

## 2012-03-05 ENCOUNTER — Encounter (HOSPITAL_COMMUNITY): Payer: Self-pay

## 2012-03-05 DIAGNOSIS — N9489 Other specified conditions associated with female genital organs and menstrual cycle: Secondary | ICD-10-CM | POA: Insufficient documentation

## 2012-03-05 DIAGNOSIS — O26859 Spotting complicating pregnancy, unspecified trimester: Secondary | ICD-10-CM | POA: Insufficient documentation

## 2012-03-05 LAB — POCT PREGNANCY, URINE: Preg Test, Ur: POSITIVE — AB

## 2012-03-05 LAB — URINE MICROSCOPIC-ADD ON

## 2012-03-05 LAB — WET PREP, GENITAL: Clue Cells Wet Prep HPF POC: NONE SEEN

## 2012-03-05 LAB — URINALYSIS, ROUTINE W REFLEX MICROSCOPIC
Bilirubin Urine: NEGATIVE
Ketones, ur: NEGATIVE mg/dL
Protein, ur: NEGATIVE mg/dL
Specific Gravity, Urine: 1.025 (ref 1.005–1.030)
Urobilinogen, UA: 0.2 mg/dL (ref 0.0–1.0)

## 2012-03-05 LAB — CBC
MCH: 29.3 pg (ref 26.0–34.0)
MCHC: 33.3 g/dL (ref 30.0–36.0)
Platelets: 247 10*3/uL (ref 150–400)
RDW: 13.2 % (ref 11.5–15.5)

## 2012-03-05 LAB — ABO/RH: ABO/RH(D): O POS

## 2012-03-05 NOTE — MAU Note (Signed)
"  Friday I passed 2 little pink balls and then I started cramping.  The cramping feels like my period is going to start.  The spotting is now brown.  I had an ectopic pregnancy a couple of years ago and that's what I am worried about."

## 2012-03-05 NOTE — MAU Note (Signed)
Pt states LMP-02/20/2012, began bleeding again Friday, 03/03/2012. Breasts have been very tender. Passed 2 tiny little pink clots, then began bleeding. Right now spotting, blood is dark brown. Feels cramping and back pain. Has also had sore throat. Denies abnormal vaginal d/c changes prior to spotting.

## 2012-03-05 NOTE — Progress Notes (Signed)
Preliminary [place] call from U/S tech = mass on LT side distinct from LT ovary

## 2012-03-05 NOTE — MAU Provider Note (Signed)
History     CSN: [id number]  Arrival date and time: 03/05/12 1015   First Provider Initiated Contact with Patient 03/05/12 1240      No chief complaint on file.  HPI 31 y.o. [id number] with + UPT and vaginal bleeding, passing small clots since yesterday, mild low abd pain "like menstrual cramps". H/O ectopic pregnancy a few years ago resolved with MTX.    Past Medical History  Diagnosis Date  . GERD (gastroesophageal reflux disease)   . Indigestion     Past Surgical History  Procedure Date  . Cesarean section   . Cardiac catheterization     Family History  Problem Relation Age of Onset  . Diabetes Maternal Aunt   . Cancer Maternal Aunt     mother's aunt Br Ca  . Diabetes Maternal Grandmother     History  Substance Use Topics  . Smoking status: Never Smoker   . Smokeless tobacco: Not on file  . Alcohol Use: No    Allergies:  Allergies  Allergen Reactions  . Latex Hives, Itching and Rash    Prescriptions prior to admission  Medication Sig Dispense Refill  . Acetaminophen-Caff-Pyrilamine (MIDOL COMPLETE PO) Take 1 tablet by mouth 2 (two) times daily as needed. For pms/cramps      . cetirizine (ZYRTEC) 10 MG tablet Take 10 mg by mouth every other day.         ROS Physical Exam   Blood pressure 93/79, pulse 102, temperature 98 F (36.7 C), temperature source Oral, resp. rate 18, height 4' 11.5" (1.511 m), weight 198 lb 8 oz (90.039 kg), last menstrual period 02/20/2012.  Physical Exam  Constitutional: She is oriented to person, place, and time. She appears well-developed and well-nourished. No distress.  HENT:  Head: Normocephalic and atraumatic.  Cardiovascular: Normal rate, regular rhythm and normal heart sounds.   Respiratory: Effort normal and breath sounds normal. No respiratory distress.  GI: Soft. Bowel sounds are normal. She exhibits no distension and no mass. There is no tenderness. There is no rebound and no guarding.  Genitourinary: There is no  rash or lesion on the right labia. There is no rash or lesion on the left labia. Uterus is not deviated, not enlarged, not fixed and not tender. Cervix exhibits no motion tenderness, no discharge and no friability. Right adnexum displays no mass, no tenderness and no fullness. Left adnexum displays no mass, no tenderness and no fullness. There is bleeding (small, brown) around the vagina. No erythema or tenderness around the vagina. No vaginal discharge found.  Neurological: She is alert and oriented to person, place, and time.  Skin: Skin is warm and dry.  Psychiatric: She has a normal mood and affect.    MAU Course  Procedures Results for orders placed during the hospital encounter of 03/05/12 (from the past 24 hour(s))  URINALYSIS, ROUTINE W REFLEX MICROSCOPIC     Status: Abnormal   Collection Time   03/05/12 10:25 AM      Component Value Range   Color, Urine YELLOW  YELLOW   APPearance HAZY (*) CLEAR   Specific Gravity, Urine 1.025  1.005 - 1.030   pH 6.0  5.0 - 8.0   Glucose, UA NEGATIVE  NEGATIVE mg/dL   Hgb urine dipstick LARGE (*) NEGATIVE   Bilirubin Urine NEGATIVE  NEGATIVE   Ketones, ur NEGATIVE  NEGATIVE mg/dL   Protein, ur NEGATIVE  NEGATIVE mg/dL   Urobilinogen, UA 0.2  0.0 - 1.0 mg/dL   Nitrite NEGATIVE  NEGATIVE   Leukocytes, UA NEGATIVE  NEGATIVE  URINE MICROSCOPIC-ADD ON     Status: Abnormal   Collection Time   03/05/12 10:25 AM      Component Value Range   Squamous Epithelial / LPF MANY (*) RARE   WBC, UA 0-2  <3 WBC/hpf   RBC / HPF 7-10  <3 RBC/hpf   Bacteria, UA FEW (*) RARE   Urine-Other MUCOUS PRESENT    POCT PREGNANCY, URINE     Status: Abnormal   Collection Time   03/05/12 10:29 AM      Component Value Range   Preg Test, Ur POSITIVE (*) NEGATIVE  CBC     Status: Normal   Collection Time   03/05/12 10:54 AM      Component Value Range   WBC 8.3  4.0 - 10.5 K/uL   RBC 4.23  3.87 - 5.11 MIL/uL   Hemoglobin 12.4  12.0 - 15.0 g/dL   HCT 16.1  09.6 - 04.5 %    MCV 87.9  78.0 - 100.0 fL   MCH 29.3  26.0 - 34.0 pg   MCHC 33.3  30.0 - 36.0 g/dL   RDW 40.9  81.1 - 91.4 %   Platelets 247  150 - 400 K/uL  ABO/RH     Status: Normal   Collection Time   03/05/12 10:54 AM      Component Value Range   ABO/RH(D) O POS    HCG, QUANTITATIVE, PREGNANCY     Status: Abnormal   Collection Time   03/05/12 10:54 AM      Component Value Range   hCG, Beta Chain, Quant, S 410 (*) <5 mIU/mL  WET PREP, GENITAL     Status: Abnormal   Collection Time   03/05/12 12:45 PM      Component Value Range   Yeast Wet Prep HPF POC NONE SEEN  NONE SEEN   Trich, Wet Prep NONE SEEN  NONE SEEN   Clue Cells Wet Prep HPF POC NONE SEEN  NONE SEEN   WBC, Wet Prep HPF POC FEW (*) NONE SEEN   US Ob Comp Less 14 Wks  03/05/2012  *RADIOLOGY REPORT*  Clinical Data: Early pregnancy.  Vaginal bleeding.  OBSTETRIC <14 WK ULTRASOUND, TRANSVAGINAL OB US  Technique:  Transabdominal and transvaginal ultrasound was performed for evaluation of the gestation as well as the maternal uterus and adnexal regions.  Findings:  No intrauterine gestational sac.  Maternal uterus/adnexae: There is a small amount of free fluid within the pelvis.  Within the left adnexa adjacent to the left ovary there is a round mass which appears to be within the fallopian tube and separate from the ovary.  This measures 0.8 x 0.7 x 0.6 cm and contains a central area of lucency.  The left ovary is normal.  The right ovary and right adnexa appear normal.  IMPRESSION: 1.  Complex mass within the left adnexa is suspicious for ectopic pregnancy. 2.  Free fluid within the pelvis. 3.  No intrauterine gestation.  Critical Value/emergent results were called by telephone at the time of interpretation on 03/05/2012 at 12:22 p.m. to Raelyn Mora, RN, who verbally acknowledged these results.   Original Report Authenticated By: Signa Kell, M.D.    US Ob Transvaginal  03/05/2012  *RADIOLOGY REPORT*  Clinical Data: Early pregnancy.   Vaginal bleeding.  OBSTETRIC <14 WK ULTRASOUND, TRANSVAGINAL OB US  Technique:  Transabdominal and transvaginal ultrasound was performed for evaluation of the gestation as well as  the maternal uterus and adnexal regions.  Findings:  No intrauterine gestational sac.  Maternal uterus/adnexae: There is a small amount of free fluid within the pelvis.  Within the left adnexa adjacent to the left ovary there is a round mass which appears to be within the fallopian tube and separate from the ovary.  This measures 0.8 x 0.7 x 0.6 cm and contains a central area of lucency.  The left ovary is normal.  The right ovary and right adnexa appear normal.  IMPRESSION: 1.  Complex mass within the left adnexa is suspicious for ectopic pregnancy. 2.  Free fluid within the pelvis. 3.  No intrauterine gestation.  Critical Value/emergent results were called by telephone at the time of interpretation on 03/05/2012 at 12:22 p.m. to Raelyn Mora, RN, who verbally acknowledged these results.   Original Report Authenticated By: Signa Kell, M.D.    Discussed with Dr. Despina Hidden - considering low HCG, small size of adnexal mass, non-acute appearance of patient and no definitive ectopic on u/s, plan to wait 48 hours, repeat quant, serum progesterone ordered today. Rev'd results with patient - stressed that u/s was high suspicious for ectopic and that she should return immediately with any worsening of symptoms, she states understanding   Assessment and Plan   1. Spotting in early pregnancy   2. Adnexal mass       Medication List     As of 03/05/2012  1:39 PM    CONTINUE taking these medications         cetirizine 10 MG tablet   Commonly known as: ZYRTEC      STOP taking these medications         MIDOL COMPLETE PO            Follow-up Information    Follow up with THE [facility] [place] [facility]. On 03/07/2012. (for repeat labs - return immediately with worsening symptoms)    Contact  information:   [address] [id number] mc [place] [address] [phone]           [name] 03/05/2012, 12:51 PM

## 2012-03-05 NOTE — Progress Notes (Signed)
Call from [facility], CNM informing MAU that patient has been discharged from the [facility] practice as of 08/27/11 and will need to be re-registered under FP.

## 2012-03-06 LAB — GC/CHLAMYDIA PROBE AMP, GENITAL: Chlamydia, DNA Probe: NEGATIVE

## 2012-03-07 ENCOUNTER — Inpatient Hospital Stay (HOSPITAL_COMMUNITY)
Admission: AD | Admit: 2012-03-07 | Discharge: 2012-03-07 | Disposition: A | Discharge status: Home / Self Care | Payer: BC Managed Care – PPO | Source: Ambulatory Visit | Attending: Family Medicine | Admitting: Family Medicine

## 2012-03-07 ENCOUNTER — Encounter (HOSPITAL_COMMUNITY): Payer: Self-pay | Admitting: *Deleted

## 2012-03-07 DIAGNOSIS — O039 Complete or unspecified spontaneous abortion without complication: Secondary | ICD-10-CM | POA: Insufficient documentation

## 2012-03-07 DIAGNOSIS — O209 Hemorrhage in early pregnancy, unspecified: Secondary | ICD-10-CM

## 2012-03-07 HISTORY — DX: Fibromyalgia: M79.7

## 2012-03-07 HISTORY — DX: Unspecified ectopic pregnancy without intrauterine pregnancy: O00.90

## 2012-03-07 HISTORY — DX: Gestational (pregnancy-induced) hypertension without significant proteinuria, unspecified trimester: O13.9

## 2012-03-07 NOTE — MAU Provider Note (Signed)
Chart reviewed and agree with management and plan.  

## 2012-03-07 NOTE — MAU Note (Signed)
Here for follow up.  Still bleeding, only noted when wipes- brownish. Passed "something yesterday", was kind of long and stringy, red in color- looked like is had veins in it"

## 2012-03-07 NOTE — MAU Provider Note (Signed)
Chief Complaint  Patient presents with  . Follow-up    Subjective 31 y.o. [id number]  presents for F/U quant. Seen here 2 days ago with abdominal pain and bleeding. Today has no pain but still has light bleeding. She states that yesterday she passed what she thought was tissue that looked stringy with white mass attached and then had some red bleeding. On 2 days ago was 410. See below for ultrasound report. Blood type: O+   PMH significant for ectopic tx'd with MTX  Objective   Filed Vitals:   03/07/12 1002  BP: 117/77  Pulse: 91  Temp: 98.8 F (37.1 C)  Resp: 18     Physical Exam: General: WN/WD in NAD  Abdom: soft, NT    Lab Results:  Results for orders placed during the hospital encounter of 03/07/12 (from the past 24 hour(s))  HCG, QUANTITATIVE, PREGNANCY     Status: Abnormal   Collection Time   03/07/12 10:03 AM      Component Value Range   hCG, Beta Chain, Quant, S 151 (*) <5 mIU/mL   Imaging: US Ob Comp Less 14 Wks  03/05/2012  *RADIOLOGY REPORT*  Clinical Data: Early pregnancy.  Vaginal bleeding.  OBSTETRIC <14 WK ULTRASOUND, TRANSVAGINAL OB US  Technique:  Transabdominal and transvaginal ultrasound was performed for evaluation of the gestation as well as the maternal uterus and adnexal regions.  Findings:  No intrauterine gestational sac.  Maternal uterus/adnexae: There is a small amount of free fluid within the pelvis.  Within the left adnexa adjacent to the left ovary there is a round mass which appears to be within the fallopian tube and separate from the ovary.  This measures 0.8 x 0.7 x 0.6 cm and contains a central area of lucency.  The left ovary is normal.  The right ovary and right adnexa appear normal.  IMPRESSION: 1.  Complex mass within the left adnexa is suspicious for ectopic pregnancy. 2.  Free fluid within the pelvis. 3.  No intrauterine gestation.  Critical Value/emergent results were called by telephone at the time of interpretation on 03/05/2012 at 12:22  p.m. to Raelyn Mora, RN, who verbally acknowledged these results.   Original Report Authenticated By: Signa Kell, M.D.    US Ob Transvaginal  03/05/2012  *RADIOLOGY REPORT*  Clinical Data: Early pregnancy.  Vaginal bleeding.  OBSTETRIC <14 WK ULTRASOUND, TRANSVAGINAL OB US  Technique:  Transabdominal and transvaginal ultrasound was performed for evaluation of the gestation as well as the maternal uterus and adnexal regions.  Findings:  No intrauterine gestational sac.  Maternal uterus/adnexae: There is a small amount of free fluid within the pelvis.  Within the left adnexa adjacent to the left ovary there is a round mass which appears to be within the fallopian tube and separate from the ovary.  This measures 0.8 x 0.7 x 0.6 cm and contains a central area of lucency.  The left ovary is normal.  The right ovary and right adnexa appear normal.  IMPRESSION: 1.  Complex mass within the left adnexa is suspicious for ectopic pregnancy. 2.  Free fluid within the pelvis. 3.  No intrauterine gestation.  Critical Value/emergent results were called by telephone at the time of interpretation on 03/05/2012 at 12:22 p.m. to Raelyn Mora, RN, who verbally acknowledged these results.   Original Report Authenticated By: Signa Kell, M.D.    Assessment Falling quantitative consistent with SAB. Ectopic not ruled out.   Plan    C/W Dr. Shawnie Pons. F/U here in  2 d for repeat quant. Home with ectopic and bleeding precautions    Keante Urizar 03/07/2012 12:25 PM

## 2012-03-09 ENCOUNTER — Inpatient Hospital Stay (HOSPITAL_COMMUNITY)
Admission: AD | Admit: 2012-03-09 | Discharge: 2012-03-09 | Disposition: A | Discharge status: Home / Self Care | Payer: BC Managed Care – PPO | Source: Ambulatory Visit | Attending: Obstetrics & Gynecology | Admitting: Obstetrics & Gynecology

## 2012-03-09 DIAGNOSIS — Z09 Encounter for follow-up examination after completed treatment for conditions other than malignant neoplasm: Secondary | ICD-10-CM

## 2012-03-09 DIAGNOSIS — O209 Hemorrhage in early pregnancy, unspecified: Secondary | ICD-10-CM | POA: Insufficient documentation

## 2012-03-09 NOTE — MAU Note (Signed)
Pt states here for f/u bhcg levels, still having bleeding, has become heavier, is still brown. Still also having abd pain however is better than 2 days ago. Feels like stomach is cramping still.

## 2012-03-09 NOTE — MAU Provider Note (Signed)
[name] is a 31 y.o. female who presents to MAU for follow up Bhcg. Her initial visit was 03/05/12 and her Bhcg was 410. Her ultrasound showed no IUP and a right adnexal mass that could not exclude ectopic. The results were discussed with the patient and decision made to return in 48 hours for follow up. The patient returned 03/07/12 and her Bhcg had dropped to 151. At that visit the patient reported that she had passed clots and what may have been tissue. Today she reports no pain as small bleeding. The Bhcg today has dropped to 60. Will have patient follow up in the GYN clinic in one week. She will return here if she has problems before then. BP 114/69  Pulse 80  Temp 98.1 F (36.7 C) (Oral)  Resp 18  Ht 4\' 10"  (1.473 m)  LMP 02/20/2012   Results and plan of care discussed with patient and she voices understanding.

## 2012-03-09 NOTE — MAU Provider Note (Signed)
Attestation of Attending Supervision of Advanced Practitioner (CNM/NP): Evaluation and management procedures were performed by the Advanced Practitioner under my supervision and collaboration.  I have reviewed the Advanced Practitioner's note and chart, and I agree with the management and plan.  HARRAWAY-SMITH, Aleathia Purdy 9:30 PM     

## 2012-03-10 ENCOUNTER — Encounter: Payer: Self-pay | Admitting: *Deleted

## 2012-03-15 ENCOUNTER — Other Ambulatory Visit: Payer: BC Managed Care – PPO

## 2012-03-15 DIAGNOSIS — O039 Complete or unspecified spontaneous abortion without complication: Secondary | ICD-10-CM

## 2012-03-16 ENCOUNTER — Other Ambulatory Visit: Payer: BC Managed Care – PPO

## 2012-03-16 LAB — HCG, QUANTITATIVE, PREGNANCY: hCG, Beta Chain, Quant, S: 4.6 m[IU]/mL

## 2012-03-22 ENCOUNTER — Telehealth: Payer: Self-pay

## 2012-03-22 NOTE — Telephone Encounter (Signed)
Pt called and want to know what if her levels have dropped to 0 due to her having a miscarriage.  Called pt and I informed pt of 4.6 result and that her body is doing what it needs to do.  Pt asked about why her hormones are all over the place.  I informed pt that her body will take time to adjust especially since her body is no longer pregnant and that is normal.  Pt stated understanding and had no further questions.

## 2012-05-29 ENCOUNTER — Emergency Department (HOSPITAL_COMMUNITY)
Admission: EM | Admit: 2012-05-29 | Discharge: 2012-05-29 | Disposition: A | Discharge status: Home / Self Care | Payer: 59 | Source: Home / Self Care | Attending: Emergency Medicine | Admitting: Emergency Medicine

## 2012-05-29 ENCOUNTER — Encounter (HOSPITAL_COMMUNITY): Payer: Self-pay | Admitting: Emergency Medicine

## 2012-05-29 DIAGNOSIS — J019 Acute sinusitis, unspecified: Secondary | ICD-10-CM

## 2012-05-29 MED ORDER — FLUTICASONE PROPIONATE 50 MCG/ACT NA SUSP: 2.0000 | Freq: Every day | NASAL | Status: DC

## 2012-05-29 MED ORDER — AMOXICILLIN 500 MG PO CAPS: 1000.0000 mg | ORAL_CAPSULE | Freq: Three times a day (TID) | ORAL | Status: DC

## 2012-05-29 MED ORDER — BENZONATATE 200 MG PO CAPS: 200.0000 mg | ORAL_CAPSULE | Freq: Three times a day (TID) | ORAL | Status: DC | PRN

## 2012-05-29 NOTE — ED Notes (Signed)
Pt c/o cold sx x1 week Sx include: cough w/green mucous, sinus pressure, chest discomfort due to cough, sore throat, dysphagia, fevers Denies: v/n/d Has been taking sudafed and mucinex w/little relief  She is alert w/no signs of acute distress.

## 2012-05-29 NOTE — ED Provider Notes (Signed)
Chief Complaint  Patient presents with  . URI    History of Present Illness:   [name]  is a 32 year old female who presents today with a one-week history of nasal congestion and sinus pain. She has had yellowish rhinorrhea, headache, sore throat, hoarseness, productive cough, aching in her rib cage, malaise, fatigue, and has felt feverish. She denies any nausea, vomiting, or other GI complaints. She has not been exposed to anything in particular. She's tried over-the-counter Mucinex without relief.  Review of Systems:  Other than noted above, the patient denies any of the following symptoms. Systemic:  No fever, chills, sweats, fatigue, myalgias, headache, or anorexia. Eye:  No redness, pain or drainage. ENT:  No earache, ear congestion, nasal congestion, sneezing, rhinorrhea, sinus pressure, sinus pain, post nasal drip, or sore throat. Lungs:  No cough, sputum production, wheezing, shortness of breath, or chest pain. GI:  No abdominal pain, nausea, vomiting, or diarrhea.  PMFSH:  Past medical history, family history, social history, meds, and allergies were reviewed.  Physical Exam:   Vital signs:  BP 127/81  Pulse 80  Temp 99.9 F (37.7 C) (Oral)  Resp 20  SpO2 98%  LMP 02/20/2012 General:  Alert, in no distress. Eye:  No conjunctival injection or drainage. Lids were normal. ENT:  TMs and canals were normal, without erythema or inflammation.  Nasal mucosa was clear and uncongested, without drainage.  Mucous membranes were moist.  Pharynx was clear, without exudate or drainage.  There were no oral ulcerations or lesions. There is pain to palpation over the maxillary sinuses. Neck:  Supple, no adenopathy, tenderness or mass. Lungs:  No respiratory distress.  Lungs were clear to auscultation, without wheezes, rales or rhonchi.  Breath sounds were clear and equal bilaterally.  Heart:  Regular rhythm, without gallops, murmers or rubs. Skin:  Clear, warm, and dry, without rash or  lesions.  Assessment:  The encounter diagnosis was Acute sinusitis.  Plan:   1.  The following meds were prescribed:   New Prescriptions   AMOXICILLIN (AMOXIL) 500 MG CAPSULE    Take 2 capsules (1,000 mg total) by mouth 3 (three) times daily.   BENZONATATE (TESSALON) 200 MG CAPSULE    Take 1 capsule (200 mg total) by mouth 3 (three) times daily as needed for cough.   FLUTICASONE (FLONASE) 50 MCG/ACT NASAL SPRAY    Place 2 sprays into the nose daily.   2.  The patient was instructed in symptomatic care and handouts were given. 3.  The patient was told to return if becoming worse in any way, if no better in 3 or 4 days, and given some red flag symptoms that would indicate earlier return.   Reuben Likes, MD 05/29/12 [phone]

## 2012-12-03 ENCOUNTER — Encounter (HOSPITAL_COMMUNITY): Payer: Self-pay | Admitting: Emergency Medicine

## 2012-12-03 ENCOUNTER — Emergency Department (HOSPITAL_COMMUNITY)
Admission: EM | Admit: 2012-12-03 | Discharge: 2012-12-04 | Disposition: A | Discharge status: Home / Self Care | Payer: 59 | Attending: Emergency Medicine | Admitting: Emergency Medicine

## 2012-12-03 DIAGNOSIS — R05 Cough: Secondary | ICD-10-CM | POA: Insufficient documentation

## 2012-12-03 DIAGNOSIS — Z8719 Personal history of other diseases of the digestive system: Secondary | ICD-10-CM | POA: Insufficient documentation

## 2012-12-03 DIAGNOSIS — Z87891 Personal history of nicotine dependence: Secondary | ICD-10-CM | POA: Insufficient documentation

## 2012-12-03 DIAGNOSIS — J3489 Other specified disorders of nose and nasal sinuses: Secondary | ICD-10-CM | POA: Insufficient documentation

## 2012-12-03 DIAGNOSIS — IMO0002 Reserved for concepts with insufficient information to code with codable children: Secondary | ICD-10-CM | POA: Insufficient documentation

## 2012-12-03 DIAGNOSIS — Z8742 Personal history of other diseases of the female genital tract: Secondary | ICD-10-CM | POA: Insufficient documentation

## 2012-12-03 DIAGNOSIS — Z91040 Latex allergy status: Secondary | ICD-10-CM | POA: Insufficient documentation

## 2012-12-03 DIAGNOSIS — Z79899 Other long term (current) drug therapy: Secondary | ICD-10-CM | POA: Insufficient documentation

## 2012-12-03 DIAGNOSIS — R059 Cough, unspecified: Secondary | ICD-10-CM | POA: Insufficient documentation

## 2012-12-03 DIAGNOSIS — J069 Acute upper respiratory infection, unspecified: Secondary | ICD-10-CM | POA: Insufficient documentation

## 2012-12-03 DIAGNOSIS — Z8739 Personal history of other diseases of the musculoskeletal system and connective tissue: Secondary | ICD-10-CM | POA: Insufficient documentation

## 2012-12-03 DIAGNOSIS — R509 Fever, unspecified: Secondary | ICD-10-CM | POA: Insufficient documentation

## 2012-12-03 DIAGNOSIS — J029 Acute pharyngitis, unspecified: Secondary | ICD-10-CM | POA: Insufficient documentation

## 2012-12-03 NOTE — ED Notes (Signed)
Pt states that she has had a cough, sore throat, and congestion for several days. Also states shes [redacted]wks pregnant

## 2012-12-04 NOTE — ED Provider Notes (Signed)
CSN: [id number]     Arrival date & time 12/03/12  2343 History     First MD Initiated Contact with Patient 12/03/12 2349     Chief Complaint  Patient presents with  . URI   (Consider location/radiation/quality/duration/timing/severity/associated sxs/prior Treatment) HPI Comments: Patient states she has had URI symptoms for the past several, days.  He is slightly screen until not taking medication.  Reports low-grade fever.  She thinks, intermittently.  She's been taking over-the-counter Tylenol, on 2 occasions with resolution of her symptoms.  She also reports sore throat  Patient is a 32 y.o. female presenting with URI. The history is provided by the patient.  URI Presenting symptoms: congestion, cough, fever, rhinorrhea and sore throat   Presenting symptoms: no ear pain and no facial pain   Severity:  Mild Timing:  Intermittent Ineffective treatments:  None tried Associated symptoms: no wheezing     Past Medical History  Diagnosis Date  . GERD (gastroesophageal reflux disease)   . Indigestion   . Pregnancy induced hypertension   . Ectopic pregnancy   . Fibromyalgia   . Ovarian cyst    Past Surgical History  Procedure Laterality Date  . Cesarean section    . Cardiac catheterization    . Therapeutic abortion     Family History  Problem Relation Age of Onset  . Diabetes Maternal Aunt   . Cancer Maternal Aunt     mother's aunt Br Ca  . Diabetes Maternal Grandmother   . Other Neg Hx    History  Substance Use Topics  . Smoking status: Former Games developer  . Smokeless tobacco: Not on file     Comment: quit 2009  . Alcohol Use: No   OB History   Grav Para Term Preterm Abortions TAB SAB Ect Mult Living   5 2 2  2 1  1  2      Review of Systems  Constitutional: Positive for fever.  HENT: Positive for congestion, sore throat and rhinorrhea. Negative for ear pain, trouble swallowing and sinus pressure.   Respiratory: Positive for cough. Negative for shortness of breath and  wheezing.   Gastrointestinal: Negative for nausea.  All other systems reviewed and are negative.    Allergies  Latex  Home Medications   Current Outpatient Rx  Name  Route  Sig  Dispense  Refill  . amoxicillin (AMOXIL) 500 MG capsule   Oral   Take 2 capsules (1,000 mg total) by mouth 3 (three) times daily.   60 capsule   0     Dispense as written.   . benzonatate (TESSALON) 200 MG capsule   Oral   Take 1 capsule (200 mg total) by mouth 3 (three) times daily as needed for cough.   30 capsule   0   . cetirizine (ZYRTEC) 10 MG tablet   Oral   Take 10 mg by mouth every other day.          . fluticasone (FLONASE) 50 MCG/ACT nasal spray   Nasal   Place 2 sprays into the nose daily.   16 g   0    BP 106/53  Pulse 112  Temp(Src) 99.1 F (37.3 C) (Oral)  Resp 18  Ht 4\' 11"  (1.499 m)  Wt 190 lb (86.183 kg)  BMI 38.35 kg/m2  SpO2 96%  LMP 02/20/2012 Physical Exam  Nursing note and vitals reviewed. Constitutional: She is oriented to person, place, and time. She appears well-developed and well-nourished.  HENT:  Right Ear:  External ear normal.  Left Ear: External ear normal.  Mouth/Throat: Oropharynx is clear and moist.  Eyes: Pupils are equal, round, and reactive to light.  Neck: Normal range of motion.  Pulmonary/Chest: Effort normal and breath sounds normal. She has no wheezes. She has no rales.  Musculoskeletal: Normal range of motion.  Neurological: She is alert and oriented to person, place, and time.  Skin: Skin is warm. No rash noted. No pallor.    ED Course   Procedures (including critical care time)  Labs Reviewed - No data to display No results found. 1. URI (upper respiratory infection)     MDM   I suggested the patient.  Can use over-the-counter Benadryl, which is safe in pregnancy.  Also recommended.  She can use over-the-counter Vicks as an inhaler in both steam and the hand-held inhaler.  Mix saline nasal spray, and follow up with her  primary care physician  Arman Filter, NP 12/04/12 0008

## 2012-12-05 ENCOUNTER — Inpatient Hospital Stay (HOSPITAL_COMMUNITY)
Admission: AD | Admit: 2012-12-05 | Discharge: 2012-12-05 | Disposition: A | Discharge status: Home / Self Care | Payer: 59 | Source: Ambulatory Visit | Attending: Obstetrics and Gynecology | Admitting: Obstetrics and Gynecology

## 2012-12-05 ENCOUNTER — Encounter (HOSPITAL_COMMUNITY): Payer: Self-pay | Admitting: Medical

## 2012-12-05 ENCOUNTER — Inpatient Hospital Stay (HOSPITAL_COMMUNITY): Payer: 59

## 2012-12-05 DIAGNOSIS — R109 Unspecified abdominal pain: Secondary | ICD-10-CM | POA: Insufficient documentation

## 2012-12-05 DIAGNOSIS — O4690 Antepartum hemorrhage, unspecified, unspecified trimester: Secondary | ICD-10-CM

## 2012-12-05 DIAGNOSIS — B9689 Other specified bacterial agents as the cause of diseases classified elsewhere: Secondary | ICD-10-CM | POA: Insufficient documentation

## 2012-12-05 DIAGNOSIS — N760 Acute vaginitis: Secondary | ICD-10-CM | POA: Insufficient documentation

## 2012-12-05 DIAGNOSIS — A499 Bacterial infection, unspecified: Secondary | ICD-10-CM | POA: Insufficient documentation

## 2012-12-05 DIAGNOSIS — O2390 Unspecified genitourinary tract infection in pregnancy, unspecified trimester: Secondary | ICD-10-CM | POA: Insufficient documentation

## 2012-12-05 DIAGNOSIS — O209 Hemorrhage in early pregnancy, unspecified: Secondary | ICD-10-CM | POA: Insufficient documentation

## 2012-12-05 LAB — WET PREP, GENITAL
Trich, Wet Prep: NONE SEEN
Yeast Wet Prep HPF POC: NONE SEEN

## 2012-12-05 LAB — URINE MICROSCOPIC-ADD ON

## 2012-12-05 LAB — URINALYSIS, ROUTINE W REFLEX MICROSCOPIC
Bilirubin Urine: NEGATIVE
Glucose, UA: NEGATIVE mg/dL
Ketones, ur: NEGATIVE mg/dL
Protein, ur: NEGATIVE mg/dL

## 2012-12-05 LAB — CBC
HCT: 37.7 % (ref 36.0–46.0)
Hemoglobin: 12.5 g/dL (ref 12.0–15.0)
RBC: 4.38 MIL/uL (ref 3.87–5.11)

## 2012-12-05 LAB — GC/CHLAMYDIA PROBE AMP: CT Probe RNA: NEGATIVE

## 2012-12-05 LAB — HCG, QUANTITATIVE, PREGNANCY: hCG, Beta Chain, Quant, S: 376 m[IU]/mL — ABNORMAL HIGH (ref <5)

## 2012-12-05 LAB — POCT PREGNANCY, URINE: Preg Test, Ur: POSITIVE — AB

## 2012-12-05 MED ORDER — METRONIDAZOLE 500 MG PO TABS: 500.0000 mg | ORAL_TABLET | Freq: Two times a day (BID) | ORAL | Status: DC

## 2012-12-05 MED ORDER — ACETAMINOPHEN 500 MG PO TABS
1000.0000 mg | ORAL_TABLET | Freq: Once | ORAL | Status: AC
  Administered 2012-12-05: 1000 mg via ORAL
  Filled 2012-12-05: qty 2

## 2012-12-05 NOTE — MAU Provider Note (Signed)
History     CSN: [id number]  Arrival date and time: 12/05/12 6578   First Provider Initiated Contact with Patient 12/05/12 0542      No chief complaint on file.  HPI Ms. [name] is a 32 y.o. [id number] at [redacted]w[redacted]d who presents to MAU today with abdominal pain and vaginal bleeding. The patient states that she began to have pain last night in her left groin and vaginal area. The pain has worsened since onset. She noted brown spotting earlier today that then turned to red and now is brown again on arrival in MAU. She denies fever, UTI symptoms, N/V/D or constipation, vaginal discharge or recent intercourse.    OB History   Grav Para Term Preterm Abortions TAB SAB Ect Mult Living   6 2 2  3 1 1 1  2       Past Medical History  Diagnosis Date  . GERD (gastroesophageal reflux disease)   . Indigestion   . Pregnancy induced hypertension   . Ectopic pregnancy   . Fibromyalgia   . Ovarian cyst     Past Surgical History  Procedure Laterality Date  . Cesarean section    . Cardiac catheterization    . Therapeutic abortion      Family History  Problem Relation Age of Onset  . Diabetes Maternal Aunt   . Cancer Maternal Aunt     mother's aunt Br Ca  . Diabetes Maternal Grandmother   . Other Neg Hx     History  Substance Use Topics  . Smoking status: Former Games developer  . Smokeless tobacco: Not on file     Comment: quit 2009  . Alcohol Use: No    Allergies:  Allergies  Allergen Reactions  . Latex Hives, Itching and Rash    Prescriptions prior to admission  Medication Sig Dispense Refill  . cetirizine (ZYRTEC) 10 MG tablet Take 10 mg by mouth every other day.       . fluticasone (FLONASE) 50 MCG/ACT nasal spray Place 2 sprays into the nose daily.  16 g  0  . [DISCONTINUED] amoxicillin (AMOXIL) 500 MG capsule Take 2 capsules (1,000 mg total) by mouth 3 (three) times daily.  60 capsule  0  . [DISCONTINUED] benzonatate (TESSALON) 200 MG capsule Take 1 capsule (200 mg total) by  mouth 3 (three) times daily as needed for cough.  30 capsule  0    Review of Systems  Constitutional: Negative for fever and malaise/fatigue.  Gastrointestinal: Positive for abdominal pain. Negative for nausea, vomiting, diarrhea and constipation.  Genitourinary: Negative for dysuria, urgency and frequency.       + vaginal bleeding Neg - vaginal discharge  Neurological: Negative for dizziness, loss of consciousness and weakness.   Physical Exam   Blood pressure 109/65, pulse 88, temperature 98.1 F (36.7 C), temperature source Oral, resp. rate 20, height 4\' 10"  (1.473 m), weight 205 lb 8 oz (93.214 kg), last menstrual period 10/25/2012, unknown if currently breastfeeding.  Physical Exam  Constitutional: She is oriented to person, place, and time. She appears well-developed and well-nourished. No distress.  HENT:  Head: Normocephalic and atraumatic.  Cardiovascular: Normal rate, regular rhythm and normal heart sounds.   Respiratory: Effort normal and breath sounds normal. No respiratory distress.  GI: Soft. Bowel sounds are normal. She exhibits no distension and no mass. There is tenderness (mild tenderness to palpation of the LLQ). There is no rebound and no guarding.  Genitourinary: Uterus is not enlarged (exam limited by  maternal body habitus) and not tender. Cervix exhibits no motion tenderness, no discharge and no friability. Right adnexum displays no mass and no tenderness. Left adnexum displays no mass and no tenderness. Bleeding: small amount of dark brown spotting noted in the vagina. No vaginal discharge found.  Neurological: She is alert and oriented to person, place, and time.  Skin: Skin is warm and dry. No erythema.  Psychiatric: She has a normal mood and affect.   Results for orders placed during the hospital encounter of 12/05/12 (from the past 24 hour(s))  URINALYSIS, ROUTINE W REFLEX MICROSCOPIC     Status: Abnormal   Collection Time    12/05/12  5:00 AM      Result  Value Range   Color, Urine YELLOW  YELLOW   APPearance CLEAR  CLEAR   Specific Gravity, Urine >1.030 (*) 1.005 - 1.030   pH 6.0  5.0 - 8.0   Glucose, UA NEGATIVE  NEGATIVE mg/dL   Hgb urine dipstick LARGE (*) NEGATIVE   Bilirubin Urine NEGATIVE  NEGATIVE   Ketones, ur NEGATIVE  NEGATIVE mg/dL   Protein, ur NEGATIVE  NEGATIVE mg/dL   Urobilinogen, UA 0.2  0.0 - 1.0 mg/dL   Nitrite NEGATIVE  NEGATIVE   Leukocytes, UA NEGATIVE  NEGATIVE  URINE MICROSCOPIC-ADD ON     Status: None   Collection Time    12/05/12  5:00 AM      Result Value Range   Squamous Epithelial / LPF RARE  RARE   RBC / HPF 0-2  <3 RBC/hpf   Bacteria, UA RARE  RARE  POCT PREGNANCY, URINE     Status: Abnormal   Collection Time    12/05/12  5:11 AM      Result Value Range   Preg Test, Ur POSITIVE (*) NEGATIVE  WET PREP, GENITAL     Status: Abnormal   Collection Time    12/05/12  5:40 AM      Result Value Range   Yeast Wet Prep HPF POC NONE SEEN  NONE SEEN   Trich, Wet Prep NONE SEEN  NONE SEEN   Clue Cells Wet Prep HPF POC FEW (*) NONE SEEN   WBC, Wet Prep HPF POC MODERATE (*) NONE SEEN  CBC     Status: None   Collection Time    12/05/12  6:00 AM      Result Value Range   WBC 10.0  4.0 - 10.5 K/uL   RBC 4.38  3.87 - 5.11 MIL/uL   Hemoglobin 12.5  12.0 - 15.0 g/dL   HCT 16.1  09.6 - 04.5 %   MCV 86.1  78.0 - 100.0 fL   MCH 28.5  26.0 - 34.0 pg   MCHC 33.2  30.0 - 36.0 g/dL   RDW 40.9  81.1 - 91.4 %   Platelets 245  150 - 400 K/uL  HCG, QUANTITATIVE, PREGNANCY     Status: Abnormal   Collection Time    12/05/12  6:00 AM      Result Value Range   hCG, Beta Chain, Quant, S 376 (*) <5 mIU/mL   US Ob Comp Less 14 Wks  12/05/2012   *RADIOLOGY REPORT*  Clinical Data: Vaginal bleeding and left lower quadrant pain.  OBSTETRIC <14 WK [place] AND TRANSVAGINAL OB US  Technique:  Both transabdominal and transvaginal ultrasound examinations were performed for complete evaluation of the gestation as well as the maternal  uterus, adnexal regions, and pelvic cul-de-sac.  Transvaginal technique was performed to  assess early pregnancy.  Comparison:  None.  Intrauterine gestational sac:  None visualized.  The endometrial stripe is mildly thickened at 1.5 cm and demonstrates motion on cine imaging consistent with the presence of fluid/hemorrhage Yolk sac: None visualized. Embryo: None visualized. Cardiac Activity: Not applicable.  Maternal uterus/adnexae: Unremarkable.  Small amount of free pelvic fluid is noted.  IMPRESSION: No evidence of intrauterine or ectopic pregnancy is identified. Recommend correlation with quantitative HCG.  Repeat ultrasound as indicated.  As noted above, the endometrium appears thickened and filled with blood.  Small amount of free pelvic fluid.   Original Report Authenticated By: Holley Dexter, M.D.   US Ob Transvaginal  12/05/2012   *RADIOLOGY REPORT*  Clinical Data: Vaginal bleeding and left lower quadrant pain.  OBSTETRIC <14 WK [place] AND TRANSVAGINAL OB US  Technique:  Both transabdominal and transvaginal ultrasound examinations were performed for complete evaluation of the gestation as well as the maternal uterus, adnexal regions, and pelvic cul-de-sac.  Transvaginal technique was performed to assess early pregnancy.  Comparison:  None.  Intrauterine gestational sac:  None visualized.  The endometrial stripe is mildly thickened at 1.5 cm and demonstrates motion on cine imaging consistent with the presence of fluid/hemorrhage Yolk sac: None visualized. Embryo: None visualized. Cardiac Activity: Not applicable.  Maternal uterus/adnexae: Unremarkable.  Small amount of free pelvic fluid is noted.  IMPRESSION: No evidence of intrauterine or ectopic pregnancy is identified. Recommend correlation with quantitative HCG.  Repeat ultrasound as indicated.  As noted above, the endometrium appears thickened and filled with blood.  Small amount of free pelvic fluid.   Original Report Authenticated By: Holley Dexter, M.D.    MAU Course  Procedures None  MDM UPT - positive UA, wet prep, GC/Chlamydia, CBC, quant hCG and [place] today O+ blood type from previous visit in Epic Discussed possibility of SAB based on [place] results. Patient is aware of all possible outcomes of this pregnancy at this time. Patient voices understanding.  Assessment and Plan  A: Vaginal bleeding in early pregnancy Bacterial vaginosis  P: Discharge home Rx for Flagyl sent to patient's pharmacy Bleeding precautions discussed Patient will return to MAU in 48 hours for follow-up quant hCG Patient may return to MAU sooner as needed or if her condition were to change or worsen  Freddi Starr, PA-C  12/05/2012, 7:15 AM

## 2012-12-05 NOTE — ED Provider Notes (Signed)
Medical screening examination/treatment/procedure(s) were performed by non-physician practitioner and as supervising physician I was immediately available for consultation/collaboration.    Celene Kras, MD 12/05/12 [phone]

## 2012-12-05 NOTE — MAU Note (Signed)
PT SAYS SHE HAS  HAD CONGESTION -  WENT  TO [facility] ON Sunday- TOLD TO TAKE COLD MED-  FEELS BETTER.    THIS AM AT 0130- STARTED FEELING LEG CRAMPS AND HER VAGINA ON LEFT SIDE HURTS- RADIATES DOWN L LEG.  TOOK TYLENOL AT 0130,  THEN TOOK MORE TYLENOL AT 0330.  WENT TO B-ROOM- SPOTTING BROWN.  AFTER SHOWER-  SPOTTING RED.  NOW IN TRIAGE - PAIN IS SAME.  HAS AN APPOINTMENT  WITH [facility]- ON Friday AT 1030.    TOOK HOME PREG TEST - 1 WEEK AGO- POSTIVE. -  X3 . LAST SEX-  July.

## 2012-12-06 NOTE — MAU Provider Note (Signed)
Attestation of Attending Supervision of Advanced Practitioner (CNM/NP): Evaluation and management procedures were performed by the Advanced Practitioner under my supervision and collaboration.  I have reviewed the Advanced Practitioner's note and chart, and I agree with the management and plan.  Larue Lightner 12/06/2012 10:55 AM   

## 2012-12-07 ENCOUNTER — Inpatient Hospital Stay (HOSPITAL_COMMUNITY)
Admission: AD | Admit: 2012-12-07 | Discharge: 2012-12-07 | Disposition: A | Discharge status: Home / Self Care | Payer: 59 | Source: Ambulatory Visit | Attending: Obstetrics & Gynecology | Admitting: Obstetrics & Gynecology

## 2012-12-07 DIAGNOSIS — O039 Complete or unspecified spontaneous abortion without complication: Secondary | ICD-10-CM

## 2012-12-07 NOTE — MAU Note (Addendum)
Did not given packet as pt had recent loss and received at that time, felt it would not be appropriate to repeat.  Did give pt blue comfort handout, reinforced Hosp comfort program and heartstrings. Encouraged pt to follow up with MD prior to attempting as CNM had encouraged also, as this is their 3rd loss.

## 2012-12-07 NOTE — MAU Provider Note (Signed)
History   Chief Complaint:  No chief complaint on file.   [name] is  32 y.o. [id number] Patient's last menstrual period was 10/25/2012.Marland Kitchen Patient is here for follow up of quantitative HCG and ongoing surveillance of pregnancy status.   She is [redacted]w[redacted]d weeks gestation  by LMP.    Since her last visit, the patient is without new complaint.   The patient reports bleeding as  spotting.    General ROS:  negative  Her previous Quantitative HCG values are:  12/05/2012: 376   Physical Exam   Last menstrual period 10/25/2012.  Focused Gynecological Exam: examination not indicated  Labs: Results for orders placed during the hospital encounter of 12/07/12 (from the past 24 hour(s))  HCG, QUANTITATIVE, PREGNANCY   Collection Time    12/07/12  7:00 AM      Result Value Range   hCG, Beta Chain, Quant, S 158 (*) <5 mIU/mL    Ultrasound Studies:   NA  Assessment: SAB. IUP not verified.  Plan: D/C home. Support given. Follow-up Information   Follow up with Gyncologist  In 2 weeks. (for follow-up appointment and for week labwork until Hcg less than 1. )     MAU PRN. Bleeding precautions. Support given.    Dorathy Kinsman 12/07/2012, 7:36 AM

## 2012-12-07 NOTE — MAU Note (Signed)
Here for repeat HCG level.  Continues to have bleeding, passed a small clot this morning, bleeding is getting lighter in amt.  Still having some cramping- pain in LLQ, pain into left leg

## 2013-05-03 NOTE — L&D Delivery Note (Signed)
Operative Delivery Note Patient pushed for 10 minutes and at 3:10 PM a viable and healthy female was delivered via Vaginal, Spontaneous Delivery.  Presentation: vertex; Position: Left,, Occiput,, Anterior;  The head delivered, and the anterior shoulder did not deliver with gentle downward traction. Shoulder Dystocia emergency was called.  McRobert's maneuver and Suprapubic pressure was  Performed simultaneously and the shoulders were then freed and the rest of the body delivered easily.  The cord was double clamped and the infant handed to the waiting pediatricians.  Cord gases were obtained.  The infant was noted to be crying and moving all extremities.  The placenta delivered spontaneously intact, 3 vessels noted.  Vagina cleared of all clots and debris. Uterine atony alleviated with massage and IV pitocin.  2nd degree lac was repaired in usual fashion with 2-0 vicryl   Delivery of the head: 12/27/2013  3:10 PM First maneuver:  12/27/2013  3:10 PM , Suprapubic Pressure McRoberts    APGAR: 7, 9; weight 7 lb 1.2 oz (3209 g).   Placenta status: Intact, Spontaneous.   Cord: 3 vessels with the following complications: None.  Cord pH: 7.3  Anesthesia: Epidural  Episiotomy: None Lacerations: 2nd degree Suture Repair: 2.0 vicryl Est. Blood Loss (mL): 350  Mom to postpartum.  Baby to Couplet care / Skin to Skin.  Essie Hart STACIA 12/27/2013, 9:04 PM

## 2013-05-12 ENCOUNTER — Inpatient Hospital Stay (HOSPITAL_COMMUNITY): Payer: 59

## 2013-05-12 ENCOUNTER — Inpatient Hospital Stay (HOSPITAL_COMMUNITY)
Admission: AD | Admit: 2013-05-12 | Discharge: 2013-05-12 | Disposition: A | Discharge status: Home / Self Care | Payer: 59 | Source: Ambulatory Visit | Attending: Obstetrics and Gynecology | Admitting: Obstetrics and Gynecology

## 2013-05-12 ENCOUNTER — Encounter (HOSPITAL_COMMUNITY): Payer: Self-pay | Admitting: *Deleted

## 2013-05-12 DIAGNOSIS — O418X10 Other specified disorders of amniotic fluid and membranes, first trimester, not applicable or unspecified: Secondary | ICD-10-CM

## 2013-05-12 DIAGNOSIS — M545 Low back pain, unspecified: Secondary | ICD-10-CM | POA: Insufficient documentation

## 2013-05-12 DIAGNOSIS — O468X1 Other antepartum hemorrhage, first trimester: Secondary | ICD-10-CM

## 2013-05-12 DIAGNOSIS — O209 Hemorrhage in early pregnancy, unspecified: Secondary | ICD-10-CM

## 2013-05-12 DIAGNOSIS — Z87891 Personal history of nicotine dependence: Secondary | ICD-10-CM | POA: Insufficient documentation

## 2013-05-12 HISTORY — DX: Unspecified infectious disease: B99.9

## 2013-05-12 HISTORY — DX: Headache: R51

## 2013-05-12 LAB — WET PREP, GENITAL
Clue Cells Wet Prep HPF POC: NONE SEEN
Trich, Wet Prep: NONE SEEN
Yeast Wet Prep HPF POC: NONE SEEN

## 2013-05-12 LAB — CBC
HCT: 35.9 % — ABNORMAL LOW (ref 36.0–46.0)
Hemoglobin: 11.9 g/dL — ABNORMAL LOW (ref 12.0–15.0)
MCH: 28.5 pg (ref 26.0–34.0)
MCHC: 33.1 g/dL (ref 30.0–36.0)
MCV: 86.1 fL (ref 78.0–100.0)
PLATELETS: 222 10*3/uL (ref 150–400)
RBC: 4.17 MIL/uL (ref 3.87–5.11)
RDW: 13.4 % (ref 11.5–15.5)
WBC: 7.9 10*3/uL (ref 4.0–10.5)

## 2013-05-12 LAB — URINALYSIS, ROUTINE W REFLEX MICROSCOPIC
Bilirubin Urine: NEGATIVE
Glucose, UA: NEGATIVE mg/dL
KETONES UR: NEGATIVE mg/dL
LEUKOCYTES UA: NEGATIVE
NITRITE: NEGATIVE
PH: 5.5 (ref 5.0–8.0)
PROTEIN: NEGATIVE mg/dL
Specific Gravity, Urine: 1.015 (ref 1.005–1.030)
Urobilinogen, UA: 0.2 mg/dL (ref 0.0–1.0)

## 2013-05-12 LAB — HCG, QUANTITATIVE, PREGNANCY: hCG, Beta Chain, Quant, S: 3542 m[IU]/mL — ABNORMAL HIGH (ref <5)

## 2013-05-12 LAB — URINE MICROSCOPIC-ADD ON

## 2013-05-12 LAB — POCT PREGNANCY, URINE: Preg Test, Ur: POSITIVE — AB

## 2013-05-12 NOTE — MAU Note (Signed)
Low back has been crampy past 2 wks, last night had a small gush of red blood with abd cramping- that stopped and bleeding went back to brownish when wipes.

## 2013-05-12 NOTE — MAU Provider Note (Signed)
History     CSN: [id number]  Arrival date and time: 05/12/13 1040   First Provider Initiated Contact with Patient 05/12/13 1148      Chief Complaint  Patient presents with  . Possible Pregnancy  . Vaginal Bleeding  . Back Pain   HPI [name] 33 y.o. 4355w1d Comes to MAU with low back pain and vaginal bleeding that started last night.  Now the bleeding is brown.  Client thinks she may have a UTI.  Also has had a previous history of ectopic pregnancy and thought with her symptoms, she should be checked.  OB History   Grav Para Term Preterm Abortions TAB SAB Ect Mult Living   7 2 2  4 1 2 1  2       Past Medical History  Diagnosis Date  . GERD (gastroesophageal reflux disease)   . Indigestion   . Ectopic pregnancy   . Ovarian cyst   . Pregnancy induced hypertension     with first  . Headache(784.0)   . Infection     UTI  . Fibromyalgia     'had it before' no problems in 5-4860yrs  . Ovarian cyst     Past Surgical History  Procedure Laterality Date  . Cesarean section    . Therapeutic abortion    . Cardiac catheterization      not a cardiac problem- really bad acid reflex    Family History  Problem Relation Age of Onset  . Diabetes Maternal Aunt   . Cancer Maternal Aunt     mother's aunt Br Ca  . Diabetes Maternal Grandmother   . Hypertension Maternal Grandmother   . Other Neg Hx     History  Substance Use Topics  . Smoking status: Former Games developermoker  . Smokeless tobacco: Never Used     Comment: quit 2009  . Alcohol Use: No    Allergies:  Allergies  Allergen Reactions  . Latex Hives, Itching and Rash    Prescriptions prior to admission  Medication Sig Dispense Refill  . acetaminophen (TYLENOL) 325 MG tablet Take 650 mg by mouth every 6 (six) hours as needed.      . cetirizine (ZYRTEC) 10 MG tablet Take 10 mg by mouth every other day.         Review of Systems  Constitutional: Negative for fever.  Gastrointestinal: Positive for abdominal pain.  Negative for nausea and vomiting.  Genitourinary:       Vaginal bleeding.  Musculoskeletal: Positive for back pain.   Physical Exam   Blood pressure 123/70, pulse 96, temperature 99.1 F (37.3 C), temperature source Oral, resp. rate 18, height 4\' 10"  (1.473 m), weight 208 lb (94.348 kg), last menstrual period 03/23/2013, unknown if currently breastfeeding.  Physical Exam  Nursing note and vitals reviewed. Constitutional: She is oriented to person, place, and time. She appears well-developed and well-nourished.  HENT:  Head: Normocephalic.  Eyes: EOM are normal.  Neck: Neck supple.  GI: Soft. There is no tenderness.  Genitourinary:  Speculum exam: Vagina - Small amount of brown blood noted, no odor Cervix - Difficult to visualize Bimanual exam: Cervix closed and behind the pubic symphysis Uterus mildly tender, enlarged and retroverted Adnexa non tender, no masses bilaterally GC/Chlam, wet prep done Chaperone present for exam.  Musculoskeletal: Normal range of motion.  Neurological: She is alert and oriented to person, place, and time.  Skin: Skin is warm and dry.  Psychiatric: She has a normal mood and affect.  MAU Course  Procedures Results for orders placed during the hospital encounter of 05/12/13 (from the past 24 hour(s))  URINALYSIS, ROUTINE W REFLEX MICROSCOPIC     Status: Abnormal   Collection Time    05/12/13 11:05 AM      Result Value Range   Color, Urine YELLOW  YELLOW   APPearance CLEAR  CLEAR   Specific Gravity, Urine 1.015  1.005 - 1.030   pH 5.5  5.0 - 8.0   Glucose, UA NEGATIVE  NEGATIVE mg/dL   Hgb urine dipstick LARGE (*) NEGATIVE   Bilirubin Urine NEGATIVE  NEGATIVE   Ketones, ur NEGATIVE  NEGATIVE mg/dL   Protein, ur NEGATIVE  NEGATIVE mg/dL   Urobilinogen, UA 0.2  0.0 - 1.0 mg/dL   Nitrite NEGATIVE  NEGATIVE   Leukocytes, UA NEGATIVE  NEGATIVE  URINE MICROSCOPIC-ADD ON     Status: Abnormal   Collection Time    05/12/13 11:05 AM      Result  Value Range   Squamous Epithelial / LPF MANY (*) RARE   WBC, UA 0-2  <3 WBC/hpf   RBC / HPF 7-10  <3 RBC/hpf   Bacteria, UA FEW (*) RARE  POCT PREGNANCY, URINE     Status: Abnormal   Collection Time    05/12/13 11:32 AM      Result Value Range   Preg Test, Ur POSITIVE (*) NEGATIVE  HCG, QUANTITATIVE, PREGNANCY     Status: Abnormal   Collection Time    05/12/13 11:56 AM      Result Value Range   hCG, Beta Chain, Quant, S 3542 (*) <5 mIU/mL  CBC     Status: Abnormal   Collection Time    05/12/13 11:56 AM      Result Value Range   WBC 7.9  4.0 - 10.5 K/uL   RBC 4.17  3.87 - 5.11 MIL/uL   Hemoglobin 11.9 (*) 12.0 - 15.0 g/dL   HCT 16.1 (*) 09.6 - 04.5 %   MCV 86.1  78.0 - 100.0 fL   MCH 28.5  26.0 - 34.0 pg   MCHC 33.1  30.0 - 36.0 g/dL   RDW 40.9  81.1 - 91.4 %   Platelets 222  150 - 400 K/uL  WET PREP, GENITAL     Status: Abnormal   Collection Time    05/12/13 12:08 PM      Result Value Range   Yeast Wet Prep HPF POC NONE SEEN  NONE SEEN   Trich, Wet Prep NONE SEEN  NONE SEEN   Clue Cells Wet Prep HPF POC NONE SEEN  NONE SEEN   WBC, Wet Prep HPF POC FEW (*) NONE SEEN    Blood type O positive  MDM Preliminary ultrasound results reviewed - IUGS with yolk sac (5w 1d) and small subchorionic hemorrhage  CLINICAL DATA: 33 year old pregnant female with pelvic pain and  bleeding. Estimated gestational age of [redacted] weeks 1 day by LMP.  EXAM:  OBSTETRIC <14 WK [place] AND TRANSVAGINAL OB US  TECHNIQUE:  Both transabdominal and transvaginal ultrasound examinations were  performed for complete evaluation of the gestation as well as the  maternal uterus, adnexal regions, and pelvic cul-de-sac.  Transvaginal technique was performed to assess early pregnancy.  COMPARISON: None.  FINDINGS:  Intrauterine gestational sac: Visualized  Yolk sac: Visualized. The mean gestational sac diameter (5 mm) is  small in relation to the yolk sac (3 mm).  Embryo: Not visualized  Cardiac Activity: Not  visualized  MSD:  5.8 mm 5 w 1 d [place] EDC: 01/11/2014  Maternal uterus/adnexae: A small subchorionic hemorrhage is noted.  The ovaries bilaterally are unremarkable.  There is no evidence of adnexal mass.  IMPRESSION:  Intrauterine gestational sac with yolk sac and small subchronic  hemorrhage. Mean sac diameter corresponds to a gestational age of [redacted]  weeks 1 day by this ultrasound. The gestational sac is small in  relation to the yolk sac - suspicious but not yet definitive for  failed pregnancy. Recommend follow-up US in 10-14 days for  definitive diagnosis. This recommendation follows SRU consensus  guidelines: Diagnostic Criteria for Nonviable Pregnancy Early in the  First Trimester. Malva Limes Med 2013; 914:7829-56.    Assessment and Plan  Bleeding in pregnancy - likely from subchorionic hemorrhage  Plan Begin prenatal care and follow up in the office. Pelvic rest if having any vaginal bleeding. Take Tylenol 325 mg 2 tablets by mouth every 4 hours if needed for pain. Drink at least 8 8-oz glasses of water every day.   BURLESON,TERRI 05/12/2013, 12:22 PM

## 2013-05-12 NOTE — Discharge Instructions (Signed)
Begin prenatal care and follow up in the office.  Pelvic rest if having any vaginal bleeding.  Take Tylenol 325 mg 2 tablets by mouth every 4 hours if needed for pain.  Drink at least 8 8-oz glasses of water every day.

## 2013-05-12 NOTE — MAU Note (Signed)
Has appt in office 01/29

## 2013-05-12 NOTE — MAU Note (Signed)
Light brownish bleeding, started last night. crampy feeling in low back, feels more like a UTI.

## 2013-05-14 LAB — GC/CHLAMYDIA PROBE AMP
CT Probe RNA: NEGATIVE
GC Probe RNA: NEGATIVE

## 2013-05-31 LAB — OB RESULTS CONSOLE GC/CHLAMYDIA
Chlamydia: NEGATIVE
Gonorrhea: NEGATIVE

## 2013-06-29 LAB — OB RESULTS CONSOLE RUBELLA ANTIBODY, IGM: RUBELLA: IMMUNE

## 2013-06-29 LAB — OB RESULTS CONSOLE HEPATITIS B SURFACE ANTIGEN: Hepatitis B Surface Ag: NEGATIVE

## 2013-06-29 LAB — OB RESULTS CONSOLE ANTIBODY SCREEN: Antibody Screen: NEGATIVE

## 2013-06-29 LAB — OB RESULTS CONSOLE RPR: RPR: NONREACTIVE

## 2013-06-29 LAB — OB RESULTS CONSOLE HIV ANTIBODY (ROUTINE TESTING): HIV: NONREACTIVE

## 2013-07-04 ENCOUNTER — Encounter (HOSPITAL_COMMUNITY): Payer: Self-pay | Admitting: Emergency Medicine

## 2013-07-04 ENCOUNTER — Emergency Department (HOSPITAL_COMMUNITY)
Admission: EM | Admit: 2013-07-04 | Discharge: 2013-07-04 | Disposition: A | Discharge status: Home / Self Care | Payer: 59 | Source: Home / Self Care | Attending: Family Medicine | Admitting: Family Medicine

## 2013-07-04 DIAGNOSIS — N390 Urinary tract infection, site not specified: Secondary | ICD-10-CM

## 2013-07-04 LAB — POCT URINALYSIS DIP (DEVICE)
Bilirubin Urine: NEGATIVE
GLUCOSE, UA: NEGATIVE mg/dL
Ketones, ur: NEGATIVE mg/dL
Leukocytes, UA: NEGATIVE
NITRITE: NEGATIVE
Protein, ur: NEGATIVE mg/dL
Specific Gravity, Urine: >=1.03 (ref 1.005–1.030)
UROBILINOGEN UA: 0.2 mg/dL (ref 0.0–1.0)
pH: 5.5 (ref 5.0–8.0)

## 2013-07-04 MED ORDER — NITROFURANTOIN MONOHYD MACRO 100 MG PO CAPS: 100.0000 mg | ORAL_CAPSULE | Freq: Two times a day (BID) | ORAL | Status: DC

## 2013-07-04 NOTE — Discharge Instructions (Signed)
Thank you for coming in today. Take nitrofurantoin twice daily Followup with your OB/GYN soon If your belly pain worsens, or you have high fever, bad vomiting, blood in your stool or black tarry stool go to the Emergency Room.  Urinary Tract Infection Urinary tract infections (UTIs) can develop anywhere along your urinary tract. Your urinary tract is your body's drainage system for removing wastes and extra water. Your urinary tract includes two kidneys, two ureters, a bladder, and a urethra. Your kidneys are a pair of bean-shaped organs. Each kidney is about the size of your fist. They are located below your ribs, one on each side of your spine. CAUSES Infections are caused by microbes, which are microscopic organisms, including fungi, viruses, and bacteria. These organisms are so small that they can only be seen through a microscope. Bacteria are the microbes that most commonly cause UTIs. SYMPTOMS  Symptoms of UTIs may vary by age and gender of the patient and by the location of the infection. Symptoms in young women typically include a frequent and intense urge to urinate and a painful, burning feeling in the bladder or urethra during urination. Older women and men are more likely to be tired, shaky, and weak and have muscle aches and abdominal pain. A fever may mean the infection is in your kidneys. Other symptoms of a kidney infection include pain in your back or sides below the ribs, nausea, and vomiting. DIAGNOSIS To diagnose a UTI, your caregiver will ask you about your symptoms. Your caregiver also will ask to provide a urine sample. The urine sample will be tested for bacteria and white blood cells. White blood cells are made by your body to help fight infection. TREATMENT  Typically, UTIs can be treated with medication. Because most UTIs are caused by a bacterial infection, they usually can be treated with the use of antibiotics. The choice of antibiotic and length of treatment depend on  your symptoms and the type of bacteria causing your infection. HOME CARE INSTRUCTIONS  If you were prescribed antibiotics, take them exactly as your caregiver instructs you. Finish the medication even if you feel better after you have only taken some of the medication.  Drink enough water and fluids to keep your urine clear or pale yellow.  Avoid caffeine, tea, and carbonated beverages. They tend to irritate your bladder.  Empty your bladder often. Avoid holding urine for long periods of time.  Empty your bladder before and after sexual intercourse.  After a bowel movement, women should cleanse from front to back. Use each tissue only once. SEEK MEDICAL CARE IF:   You have back pain.  You develop a fever.  Your symptoms do not begin to resolve within 3 days. SEEK IMMEDIATE MEDICAL CARE IF:   You have severe back pain or lower abdominal pain.  You develop chills.  You have nausea or vomiting.  You have continued burning or discomfort with urination. MAKE SURE YOU:   Understand these instructions.  Will watch your condition.  Will get help right away if you are not doing well or get worse. Document Released: 01/27/2005 Document Revised: 10/19/2011 Document Reviewed: 05/28/2011 [facility] Patient Information 2014 [place], [place].

## 2013-07-04 NOTE — ED Provider Notes (Signed)
[name] is a 33 y.o. female who presents to Urgent Care today for urinary frequency urgency and mild dysuria present for one day. No fevers chills nausea vomiting or diarrhea. Patient is approximately [redacted] weeks pregnant.   Past Medical History  Diagnosis Date  . GERD (gastroesophageal reflux disease)   . Indigestion   . Ectopic pregnancy   . Ovarian cyst   . Pregnancy induced hypertension     with first  . Headache(784.0)   . Infection     UTI  . Fibromyalgia     'had it before' no problems in 5-52106yrs  . Ovarian cyst    History  Substance Use Topics  . Smoking status: Former Games developermoker  . Smokeless tobacco: Never Used     Comment: quit 2009  . Alcohol Use: No   ROS as above Medications: No current facility-administered medications for this encounter.   Current Outpatient Prescriptions  Medication Sig Dispense Refill  . acetaminophen (TYLENOL) 325 MG tablet Take 650 mg by mouth every 6 (six) hours as needed.      . cetirizine (ZYRTEC) 10 MG tablet Take 10 mg by mouth every other day.       . nitrofurantoin, macrocrystal-monohydrate, (MACROBID) 100 MG capsule Take 1 capsule (100 mg total) by mouth 2 (two) times daily.  10 capsule  0  . [DISCONTINUED] sodium chloride (OCEAN) 0.65 % nasal spray Place 1 spray into the nose as needed for congestion.  15 mL  12    Exam:  BP 100/67  Pulse 97  Temp(Src) 99.1 F (37.3 C) (Oral)  Resp 22  SpO2 98%  LMP 03/23/2013 Gen: Well NAD HEENT: EOMI,  MMM Lungs: Normal work of breathing. CTABL Heart: RRR no MRG Abd: NABS, Soft. NT, ND no CV angle tenderness to percussion Exts: Brisk capillary refill, warm and well perfused.   Results for orders placed during the hospital encounter of 07/04/13 (from the past 24 hour(s))  POCT URINALYSIS DIP (DEVICE)     Status: Abnormal   Collection Time    07/04/13  8:04 PM      Result Value Ref Range   Glucose, UA NEGATIVE  NEGATIVE mg/dL   Bilirubin Urine NEGATIVE  NEGATIVE   Ketones, ur  NEGATIVE  NEGATIVE mg/dL   Specific Gravity, Urine >=1.030  1.005 - 1.030   Hgb urine dipstick TRACE (*) NEGATIVE   pH 5.5  5.0 - 8.0   Protein, ur NEGATIVE  NEGATIVE mg/dL   Urobilinogen, UA 0.2  0.0 - 1.0 mg/dL   Nitrite NEGATIVE  NEGATIVE   Leukocytes, UA NEGATIVE  NEGATIVE   No results found.  Assessment and Plan: 33 y.o. female with possible UTI. Urine culture pending. Treat empirically with Macrobid  Discussed warning signs or symptoms. Please see discharge instructions. Patient expresses understanding.    Rodolph BongEvan S Lavonne Kinderman, MD 07/04/13 2021

## 2013-07-04 NOTE — ED Notes (Signed)
C/o feeling like she has got to urinate, but unable to pass any urine .  Reports having to go frequently.  Patient says she is [redacted] weeks pregnant, and has been told her uterus is tilted causing symptoms.

## 2013-07-06 LAB — URINE CULTURE: Special Requests: NORMAL

## 2013-08-30 ENCOUNTER — Other Ambulatory Visit: Payer: Self-pay | Admitting: Gastroenterology

## 2013-08-30 DIAGNOSIS — R7989 Other specified abnormal findings of blood chemistry: Secondary | ICD-10-CM

## 2013-08-30 DIAGNOSIS — R945 Abnormal results of liver function studies: Principal | ICD-10-CM

## 2013-08-30 DIAGNOSIS — R112 Nausea with vomiting, unspecified: Secondary | ICD-10-CM

## 2013-09-03 ENCOUNTER — Ambulatory Visit
Admission: RE | Admit: 2013-09-03 | Discharge: 2013-09-03 | Disposition: A | Discharge status: Home / Self Care | Payer: 59 | Source: Ambulatory Visit | Attending: Gastroenterology | Admitting: Gastroenterology

## 2013-09-03 DIAGNOSIS — R112 Nausea with vomiting, unspecified: Secondary | ICD-10-CM

## 2013-09-03 DIAGNOSIS — R7989 Other specified abnormal findings of blood chemistry: Secondary | ICD-10-CM

## 2013-09-03 DIAGNOSIS — R945 Abnormal results of liver function studies: Principal | ICD-10-CM

## 2013-09-06 ENCOUNTER — Encounter (INDEPENDENT_AMBULATORY_CARE_PROVIDER_SITE_OTHER): Payer: Self-pay | Admitting: Surgery

## 2013-09-25 ENCOUNTER — Encounter (INDEPENDENT_AMBULATORY_CARE_PROVIDER_SITE_OTHER): Payer: Self-pay | Admitting: Surgery

## 2013-09-25 ENCOUNTER — Ambulatory Visit (INDEPENDENT_AMBULATORY_CARE_PROVIDER_SITE_OTHER): Payer: Medicaid Other | Admitting: Surgery

## 2013-09-25 VITALS — BP 130/72 | HR 103 | Temp 98.0°F | Resp 18 | Ht 59.0 in | Wt 219.2 lb

## 2013-09-25 DIAGNOSIS — K8020 Calculus of gallbladder without cholecystitis without obstruction: Secondary | ICD-10-CM

## 2013-09-25 NOTE — Progress Notes (Addendum)
Patient ID: [name], female   DOB: 05-06-1980, 33 y.o.   MRN: [id number]  Chief Complaint  Patient presents with  . New Evaluation    gallbladder    HPI [name] is a 33 y.o. female.   HPI She is referred by Dr. Loreta Ave. She has known cholelithiasis. She is approximately [redacted] weeks pregnant. Unlike her other pregnancies, she has persistent nausea as well as right upper quadrant abdominal pain hurting due to her back. This occurs after all fatty meals. She has occasional emesis. She is otherwise without complaints. Past Medical History  Diagnosis Date  . GERD (gastroesophageal reflux disease)   . Indigestion   . Ectopic pregnancy   . Ovarian cyst   . Pregnancy induced hypertension     with first  . Headache(784.0)   . Infection     UTI  . Fibromyalgia     'had it before' no problems in 5-25yrs  . Ovarian cyst     Past Surgical History  Procedure Laterality Date  . Cesarean section    . Therapeutic abortion    . Cardiac catheterization      not a cardiac problem- really bad acid reflex    Family History  Problem Relation Age of Onset  . Diabetes Maternal Aunt   . Cancer Maternal Aunt     mother's aunt Br Ca  . Diabetes Maternal Grandmother   . Hypertension Maternal Grandmother   . Other Neg Hx     Social History History  Substance Use Topics  . Smoking status: Former Games developer  . Smokeless tobacco: Never Used     Comment: quit 2009  . Alcohol Use: No    Allergies  Allergen Reactions  . Latex Hives, Itching and Rash    Current Outpatient Prescriptions  Medication Sig Dispense Refill  . acetaminophen (TYLENOL) 325 MG tablet Take 650 mg by mouth every 6 (six) hours as needed.      . cetirizine (ZYRTEC) 10 MG tablet Take 10 mg by mouth every other day.       . Prenatal Multivit-Min-Fe-FA (PRE-NATAL PO) Take by mouth.      . nitrofurantoin, macrocrystal-monohydrate, (MACROBID) 100 MG capsule Take 1 capsule (100 mg total) by mouth 2 (two) times daily.  10  capsule  0  . [DISCONTINUED] sodium chloride (OCEAN) 0.65 % nasal spray Place 1 spray into the nose as needed for congestion.  15 mL  12   No current facility-administered medications for this visit.    Review of Systems Review of Systems  Constitutional: Negative for fever, chills and unexpected weight change.  HENT: Negative for congestion, hearing loss, sore throat, trouble swallowing and voice change.   Eyes: Negative for visual disturbance.  Respiratory: Negative for cough and wheezing.   Cardiovascular: Negative for chest pain, palpitations and leg swelling.  Gastrointestinal: Positive for nausea, vomiting and abdominal pain. Negative for diarrhea, constipation, blood in stool, abdominal distention and anal bleeding.  Genitourinary: Negative for hematuria, vaginal bleeding and difficulty urinating.  Musculoskeletal: Negative for arthralgias.  Skin: Negative for rash and wound.  Neurological: Negative for seizures, syncope and headaches.  Hematological: Negative for adenopathy. Does not bruise/bleed easily.  Psychiatric/Behavioral: Negative for confusion.    Blood pressure 130/72, pulse 103, temperature 98 F (36.7 C), resp. rate 18, height 4\' 11"  (1.499 m), weight 219 lb 3.2 oz (99.428 kg), last menstrual period 03/23/2013, unknown if currently breastfeeding.  Physical Exam Physical Exam  Constitutional: She is oriented to person, place,  and time. She appears well-developed and well-nourished. No distress.  HENT:  Head: Normocephalic and atraumatic.  Right Ear: External ear normal.  Left Ear: External ear normal.  Nose: Nose normal.  Mouth/Throat: Oropharynx is clear and moist. No oropharyngeal exudate.  Eyes: Conjunctivae are normal. Pupils are equal, round, and reactive to light. Right eye exhibits no discharge. Left eye exhibits no discharge. No scleral icterus.  Neck: Normal range of motion. Neck supple. No tracheal deviation present.  Cardiovascular: Normal rate,  regular rhythm, normal heart sounds and intact distal pulses.   Pulmonary/Chest: Effort normal and breath sounds normal.  Abdominal: Soft. Bowel sounds are normal. There is tenderness.  She is obese. She is gravid. There is mild tenderness with guarding in the right upper quadrant  Musculoskeletal: Normal range of motion. She exhibits no edema and no tenderness.  Lymphadenopathy:    She has no cervical adenopathy.  Neurological: She is alert and oriented to person, place, and time.  Skin: Skin is warm and dry. No rash noted. She is not diaphoretic. No erythema.  Psychiatric: Her behavior is normal. Judgment normal.    Data Reviewed I have her ultrasound demonstrating cholelithiasis. The bile duct is normal and there is no gallbladder wall thickening  Assessment    Symptomatic cholelithiasis in pregnancy     Plan    I discussed this with her in detail. I discussed continued expectant management versus laparoscopic cholecystectomy. Given her persistent nausea and RUQ abdominal pain, I think she needs her gallbladder removed now rather than trying to wait until after delivery for fear of possible cholecystitis or pancreatitis which could severely compromise her pregnancy and even result in the need for an open rather than a laparoscopic procedure. I discussed the laparoscopic approach. I discussed the risk of surgery which includes but is not limited to bleeding, infection, injury to surrounding structures, need for conversion to an open procedure, preterm labor, etc. She understands and wishes to proceed with surgery which will be scheduled.        Shelly RubensteinDouglas A Marylen Zuk 09/25/2013, 5:50 PM

## 2013-09-28 ENCOUNTER — Telehealth (INDEPENDENT_AMBULATORY_CARE_PROVIDER_SITE_OTHER): Payer: Self-pay

## 2013-09-28 NOTE — Telephone Encounter (Signed)
Pt is scheduled for lap chole on 10/11/13.  At that time she will be [redacted] weeks pregnant.  Is this a problem?  She was under the impression that surgery would not be possible in her second trimester.  Message sent to Dr. Magnus Ivan for review.

## 2013-10-01 ENCOUNTER — Telehealth (INDEPENDENT_AMBULATORY_CARE_PROVIDER_SITE_OTHER): Payer: Self-pay | Admitting: General Surgery

## 2013-10-01 NOTE — Telephone Encounter (Signed)
She is fine to have her surgery then regarding her pregnancy.  i will be able to do it laparoscopically

## 2013-10-01 NOTE — Telephone Encounter (Signed)
Called patient to let her know that I will have the schedule paged Dr Magnus Ivan to let him know that she will be in her 3rd trimester on 10-09-13. Dr Magnus Ivan told the patient that he will do the surgery when she is in her 2nd trimester. I spoke to [place] in scheduling and she will get with Eunice Blase to see if we can re-schedule the patient

## 2013-10-01 NOTE — Telephone Encounter (Signed)
Message copied by Wilder Glade on Mon Oct 01, 2013 11:01 AM ------      Message from: Kerrin Champagne      Created: Mon Oct 01, 2013 10:49 AM      Contact: [phone]       Pt is schedule for sx but she will be in her 3rd trimester when sx is schedule. Can she get it done sooner. Please call patient. ------

## 2013-10-04 ENCOUNTER — Encounter (HOSPITAL_COMMUNITY): Payer: Self-pay | Admitting: Pharmacist

## 2013-10-11 ENCOUNTER — Encounter (HOSPITAL_COMMUNITY)
Admission: RE | Disposition: A | Discharge status: Home / Self Care | Payer: Self-pay | Source: Ambulatory Visit | Attending: Obstetrics and Gynecology

## 2013-10-11 ENCOUNTER — Encounter (HOSPITAL_COMMUNITY): Payer: Self-pay | Admitting: Anesthesiology

## 2013-10-11 ENCOUNTER — Ambulatory Visit (HOSPITAL_COMMUNITY): Payer: Medicaid Other | Admitting: Anesthesiology

## 2013-10-11 ENCOUNTER — Observation Stay (HOSPITAL_COMMUNITY)
Admission: RE | Admit: 2013-10-11 | Discharge: 2013-10-12 | Disposition: A | Discharge status: Home / Self Care | Payer: Medicaid Other | Source: Ambulatory Visit | Attending: Obstetrics and Gynecology | Admitting: Obstetrics and Gynecology

## 2013-10-11 ENCOUNTER — Encounter (HOSPITAL_COMMUNITY): Payer: Medicaid Other | Admitting: Anesthesiology

## 2013-10-11 DIAGNOSIS — Z3A27 27 weeks gestation of pregnancy: Secondary | ICD-10-CM

## 2013-10-11 DIAGNOSIS — R51 Headache: Secondary | ICD-10-CM | POA: Insufficient documentation

## 2013-10-11 DIAGNOSIS — IMO0001 Reserved for inherently not codable concepts without codable children: Secondary | ICD-10-CM | POA: Insufficient documentation

## 2013-10-11 DIAGNOSIS — O9989 Other specified diseases and conditions complicating pregnancy, childbirth and the puerperium: Principal | ICD-10-CM | POA: Insufficient documentation

## 2013-10-11 DIAGNOSIS — K8010 Calculus of gallbladder with chronic cholecystitis without obstruction: Secondary | ICD-10-CM

## 2013-10-11 DIAGNOSIS — K8020 Calculus of gallbladder without cholecystitis without obstruction: Secondary | ICD-10-CM | POA: Insufficient documentation

## 2013-10-11 DIAGNOSIS — K219 Gastro-esophageal reflux disease without esophagitis: Secondary | ICD-10-CM | POA: Insufficient documentation

## 2013-10-11 HISTORY — PX: CHOLECYSTECTOMY: SHX55

## 2013-10-11 LAB — CBC
HEMATOCRIT: 32.2 % — AB (ref 36.0–46.0)
Hemoglobin: 10.6 g/dL — ABNORMAL LOW (ref 12.0–15.0)
MCH: 29.4 pg (ref 26.0–34.0)
MCHC: 32.9 g/dL (ref 30.0–36.0)
MCV: 89.4 fL (ref 78.0–100.0)
Platelets: 225 10*3/uL (ref 150–400)
RBC: 3.6 MIL/uL — ABNORMAL LOW (ref 3.87–5.11)
RDW: 13.8 % (ref 11.5–15.5)
WBC: 12.4 10*3/uL — ABNORMAL HIGH (ref 4.0–10.5)

## 2013-10-11 SURGERY — LAPAROSCOPIC CHOLECYSTECTOMY
Anesthesia: General | Site: Abdomen

## 2013-10-11 MED ORDER — PHENYLEPHRINE 40 MCG/ML (10ML) SYRINGE FOR IV PUSH (FOR BLOOD PRESSURE SUPPORT)
PREFILLED_SYRINGE | INTRAVENOUS | Status: AC
  Filled 2013-10-11: qty 5

## 2013-10-11 MED ORDER — ONDANSETRON HCL 4 MG/2ML IJ SOLN
INTRAMUSCULAR | Status: DC | PRN
  Administered 2013-10-11: 4 mg via INTRAVENOUS

## 2013-10-11 MED ORDER — ONDANSETRON HCL 4 MG/2ML IJ SOLN
INTRAMUSCULAR | Status: AC
  Filled 2013-10-11: qty 2

## 2013-10-11 MED ORDER — RINGERS IRRIGATION IR SOLN
Status: DC | PRN
  Administered 2013-10-11: 1

## 2013-10-11 MED ORDER — PROPOFOL 10 MG/ML IV EMUL
INTRAVENOUS | Status: AC
  Filled 2013-10-11: qty 20

## 2013-10-11 MED ORDER — PHENYLEPHRINE HCL 10 MG/ML IJ SOLN
INTRAMUSCULAR | Status: DC | PRN
  Administered 2013-10-11: 40 ug via INTRAVENOUS
  Administered 2013-10-11: 80 ug via INTRAVENOUS

## 2013-10-11 MED ORDER — ACETAMINOPHEN 650 MG RE SUPP
650.0000 mg | RECTAL | Status: DC | PRN
  Filled 2013-10-11: qty 1

## 2013-10-11 MED ORDER — FENTANYL CITRATE 0.05 MG/ML IJ SOLN
INTRAMUSCULAR | Status: DC | PRN
  Administered 2013-10-11 (×2): 50 ug via INTRAVENOUS
  Administered 2013-10-11: 200 ug via INTRAVENOUS
  Administered 2013-10-11: 50 ug via INTRAVENOUS

## 2013-10-11 MED ORDER — SIMETHICONE 80 MG PO CHEW
160.0000 mg | CHEWABLE_TABLET | Freq: Four times a day (QID) | ORAL | Status: DC | PRN
  Administered 2013-10-11 – 2013-10-12 (×3): 160 mg via ORAL
  Filled 2013-10-11 (×4): qty 2

## 2013-10-11 MED ORDER — SODIUM CHLORIDE 0.9 % IJ SOLN: 3.0000 mL | INTRAMUSCULAR | Status: DC | PRN

## 2013-10-11 MED ORDER — FENTANYL CITRATE 0.05 MG/ML IJ SOLN
INTRAMUSCULAR | Status: AC
  Administered 2013-10-11: 50 ug via INTRAVENOUS
  Filled 2013-10-11: qty 2

## 2013-10-11 MED ORDER — HYDROCODONE-ACETAMINOPHEN 5-325 MG PO TABS: 1.0000 | ORAL_TABLET | Freq: Four times a day (QID) | ORAL | Status: DC | PRN

## 2013-10-11 MED ORDER — MEPERIDINE HCL 25 MG/ML IJ SOLN
6.2500 mg | INTRAMUSCULAR | Status: DC | PRN
Start: 2013-10-11 — End: 2013-10-11

## 2013-10-11 MED ORDER — FENTANYL CITRATE 0.05 MG/ML IJ SOLN
25.0000 ug | INTRAMUSCULAR | Status: DC | PRN
  Administered 2013-10-11 (×3): 50 ug via INTRAVENOUS

## 2013-10-11 MED ORDER — FENTANYL CITRATE 0.05 MG/ML IJ SOLN
INTRAMUSCULAR | Status: AC
  Filled 2013-10-11: qty 2

## 2013-10-11 MED ORDER — SUCCINYLCHOLINE CHLORIDE 20 MG/ML IJ SOLN
INTRAMUSCULAR | Status: DC | PRN
Start: 2013-10-11 — End: 2013-10-11
  Administered 2013-10-11: 120 mg via INTRAVENOUS

## 2013-10-11 MED ORDER — DEXAMETHASONE SODIUM PHOSPHATE 10 MG/ML IJ SOLN
INTRAMUSCULAR | Status: AC
  Filled 2013-10-11: qty 1

## 2013-10-11 MED ORDER — MORPHINE SULFATE 4 MG/ML IJ SOLN
INTRAMUSCULAR | Status: AC
  Filled 2013-10-11: qty 1

## 2013-10-11 MED ORDER — BUPIVACAINE HCL (PF) 0.5 % IJ SOLN
INTRAMUSCULAR | Status: AC
Start: 2013-10-11 — End: 2013-10-11
  Filled 2013-10-11: qty 30

## 2013-10-11 MED ORDER — ZOLPIDEM TARTRATE 5 MG PO TABS: 5.0000 mg | ORAL_TABLET | Freq: Every evening | ORAL | Status: DC | PRN

## 2013-10-11 MED ORDER — GLYCOPYRROLATE 0.2 MG/ML IJ SOLN
INTRAMUSCULAR | Status: AC
  Filled 2013-10-11: qty 4

## 2013-10-11 MED ORDER — NEOSTIGMINE METHYLSULFATE 10 MG/10ML IV SOLN
INTRAVENOUS | Status: AC
  Filled 2013-10-11: qty 1

## 2013-10-11 MED ORDER — OXYCODONE HCL 5 MG PO TABS
5.0000 mg | ORAL_TABLET | ORAL | Status: DC | PRN
  Administered 2013-10-11 – 2013-10-12 (×5): 10 mg via ORAL
  Filled 2013-10-11 (×5): qty 2

## 2013-10-11 MED ORDER — FENTANYL CITRATE 0.05 MG/ML IJ SOLN
25.0000 ug | INTRAMUSCULAR | Status: DC | PRN
  Administered 2013-10-11: 50 ug via INTRAVENOUS

## 2013-10-11 MED ORDER — FENTANYL CITRATE 0.05 MG/ML IJ SOLN: INTRAMUSCULAR | Status: DC | PRN

## 2013-10-11 MED ORDER — SODIUM CHLORIDE 0.9 % IJ SOLN
3.0000 mL | Freq: Two times a day (BID) | INTRAMUSCULAR | Status: DC
  Administered 2013-10-11 – 2013-10-12 (×2): 3 mL via INTRAVENOUS

## 2013-10-11 MED ORDER — MORPHINE SULFATE 4 MG/ML IJ SOLN
1.0000 mg | INTRAMUSCULAR | Status: DC | PRN
  Administered 2013-10-11 (×4): 2 mg via INTRAVENOUS
  Filled 2013-10-11: qty 1

## 2013-10-11 MED ORDER — GLYCOPYRROLATE 0.2 MG/ML IJ SOLN
INTRAMUSCULAR | Status: DC | PRN
  Administered 2013-10-11: 0.6 mg via INTRAVENOUS

## 2013-10-11 MED ORDER — ACETAMINOPHEN 325 MG PO TABS: 650.0000 mg | ORAL_TABLET | ORAL | Status: DC | PRN

## 2013-10-11 MED ORDER — ROCURONIUM BROMIDE 100 MG/10ML IV SOLN
INTRAVENOUS | Status: DC | PRN
  Administered 2013-10-11: 10 mg via INTRAVENOUS
  Administered 2013-10-11: 20 mg via INTRAVENOUS

## 2013-10-11 MED ORDER — SUCCINYLCHOLINE CHLORIDE 20 MG/ML IJ SOLN
INTRAMUSCULAR | Status: AC
  Filled 2013-10-11: qty 10

## 2013-10-11 MED ORDER — BUPIVACAINE HCL (PF) 0.5 % IJ SOLN
INTRAMUSCULAR | Status: DC | PRN
  Administered 2013-10-11: 20 mL

## 2013-10-11 MED ORDER — CEFAZOLIN SODIUM-DEXTROSE 2-3 GM-% IV SOLR
2.0000 g | INTRAVENOUS | Status: AC
  Administered 2013-10-11: 2 g via INTRAVENOUS

## 2013-10-11 MED ORDER — LACTATED RINGERS IV SOLN
INTRAVENOUS | Status: DC
  Administered 2013-10-11 (×2): via INTRAVENOUS

## 2013-10-11 MED ORDER — LIDOCAINE HCL (CARDIAC) 20 MG/ML IV SOLN
INTRAVENOUS | Status: DC | PRN
  Administered 2013-10-11: 60 mg via INTRAVENOUS

## 2013-10-11 MED ORDER — NEOSTIGMINE METHYLSULFATE 10 MG/10ML IV SOLN
INTRAVENOUS | Status: DC | PRN
  Administered 2013-10-11: 3 mg via INTRAVENOUS

## 2013-10-11 MED ORDER — LIDOCAINE HCL (CARDIAC) 20 MG/ML IV SOLN
INTRAVENOUS | Status: AC
  Filled 2013-10-11: qty 5

## 2013-10-11 MED ORDER — FENTANYL CITRATE 0.05 MG/ML IJ SOLN
INTRAMUSCULAR | Status: AC
  Filled 2013-10-11: qty 5

## 2013-10-11 MED ORDER — ONDANSETRON HCL 4 MG/2ML IJ SOLN
4.0000 mg | Freq: Four times a day (QID) | INTRAMUSCULAR | Status: DC | PRN
  Administered 2013-10-11 – 2013-10-12 (×2): 4 mg via INTRAVENOUS
  Filled 2013-10-11 (×2): qty 2

## 2013-10-11 MED ORDER — CEFAZOLIN SODIUM-DEXTROSE 2-3 GM-% IV SOLR
INTRAVENOUS | Status: AC
  Filled 2013-10-11: qty 50

## 2013-10-11 MED ORDER — PROPOFOL 10 MG/ML IV BOLUS
INTRAVENOUS | Status: DC | PRN
  Administered 2013-10-11: 200 mg via INTRAVENOUS

## 2013-10-11 MED ORDER — METOCLOPRAMIDE HCL 5 MG/ML IJ SOLN: 10.0000 mg | Freq: Once | INTRAMUSCULAR | Status: DC | PRN

## 2013-10-11 MED ORDER — SODIUM CHLORIDE 0.9 % IV SOLN: 250.0000 mL | INTRAVENOUS | Status: DC | PRN

## 2013-10-11 SURGICAL SUPPLY — 42 items
APL SKNCLS STERI-STRIP NONHPOA (GAUZE/BANDAGES/DRESSINGS) ×1
APPLIER CLIP 5 13 M/L LIGAMAX5 (MISCELLANEOUS) ×3
APPLIER CLIP ROT 10 11.4 M/L (STAPLE) ×3
APR CLP MED LRG 11.4X10 (STAPLE) ×1
APR CLP MED LRG 5 ANG JAW (MISCELLANEOUS) ×1
BAG SPEC RTRVL LRG 6X4 10 (ENDOMECHANICALS) ×1
BANDAGE ADH SHEER 1  50/CT (GAUZE/BANDAGES/DRESSINGS) ×12 IMPLANT
BENZOIN TINCTURE PRP APPL 2/3 (GAUZE/BANDAGES/DRESSINGS) ×3 IMPLANT
CABLE HIGH FREQUENCY MONO STRZ (ELECTRODE) ×3 IMPLANT
CANISTER SUCT 3000ML (MISCELLANEOUS) ×3 IMPLANT
CHLORAPREP W/TINT 26ML (MISCELLANEOUS) ×3 IMPLANT
CLIP APPLIE 5 13 M/L LIGAMAX5 (MISCELLANEOUS) IMPLANT
CLIP APPLIE ROT 10 11.4 M/L (STAPLE) ×1 IMPLANT
CLOSURE WOUND 1/2 X4 (GAUZE/BANDAGES/DRESSINGS) ×1
CLOTH BEACON ORANGE TIMEOUT ST (SAFETY) ×3 IMPLANT
COVER LIGHT HANDLE  1/PK (MISCELLANEOUS) ×4
COVER LIGHT HANDLE 1/PK (MISCELLANEOUS) ×2 IMPLANT
DECANTER SPIKE VIAL GLASS SM (MISCELLANEOUS) ×3 IMPLANT
DRAPE LAPAROSCOPIC ABDOMINAL (DRAPES) ×3 IMPLANT
DRSG COVADERM PLUS 2X2 (GAUZE/BANDAGES/DRESSINGS) ×6 IMPLANT
DRSG OPSITE POSTOP 3X4 (GAUZE/BANDAGES/DRESSINGS) ×3 IMPLANT
ELECT REM PT RETURN 9FT ADLT (ELECTROSURGICAL) ×3
ELECTRODE REM PT RTRN 9FT ADLT (ELECTROSURGICAL) ×1 IMPLANT
EVACUATOR SMOKE 8.L (FILTER) ×3 IMPLANT
GLOVE SURG SIGNA 7.5 PF LTX (GLOVE) ×6 IMPLANT
GOWN STRL NON-REIN LRG LVL3 (GOWN DISPOSABLE) ×3 IMPLANT
GOWN STRL REUS W/TWL LRG LVL3 (GOWN DISPOSABLE) ×6 IMPLANT
HEMOSTAT SURGICEL 4X8 (HEMOSTASIS) IMPLANT
KIT BASIN OR (CUSTOM PROCEDURE TRAY) ×3 IMPLANT
NS IRRIG 1000ML POUR BTL (IV SOLUTION) IMPLANT
POUCH SPECIMEN RETRIEVAL 10MM (ENDOMECHANICALS) ×3 IMPLANT
SCISSORS LAP 5X35 DISP (ENDOMECHANICALS) ×3 IMPLANT
SET IRRIG TUBING LAPAROSCOPIC (IRRIGATION / IRRIGATOR) ×3 IMPLANT
SOLUTION ANTI FOG 6CC (MISCELLANEOUS) ×3 IMPLANT
STRIP CLOSURE SKIN 1/2X4 (GAUZE/BANDAGES/DRESSINGS) ×2 IMPLANT
SUT MNCRL AB 4-0 PS2 18 (SUTURE) ×3 IMPLANT
SUT VICRYL 0 UR6 27IN ABS (SUTURE) ×3 IMPLANT
TOWEL OR 17X24 6PK STRL BLUE (TOWEL DISPOSABLE) IMPLANT
TRAY LAP CHOLE (CUSTOM PROCEDURE TRAY) ×3 IMPLANT
TROCAR BLADELESS OPT 5 75 (ENDOMECHANICALS) ×9 IMPLANT
TROCAR XCEL BLUNT TIP 100MML (ENDOMECHANICALS) ×3 IMPLANT
TUBING INSUFFLATION W/FILTER (TUBING) ×3 IMPLANT

## 2013-10-11 NOTE — Progress Notes (Signed)
Called to OR to check Fetal Heart rate prior to anesthesia induction. FH easily found at 145 bpm via hand held doppler.

## 2013-10-11 NOTE — Transfer of Care (Signed)
Immediate Anesthesia Transfer of Care Note  Patient: [name]  Procedure(s) Performed: Procedure(s): LAPAROSCOPIC CHOLECYSTECTOMY (N/A)  Patient Location: PACU  Anesthesia Type:General  Level of Consciousness: awake  Airway & Oxygen Therapy: Patient Spontanous Breathing and Patient connected to face mask oxygen  Post-op Assessment: Report given to PACU RN and Post -op Vital signs reviewed and stable  Post vital signs: stable  Complications: No apparent anesthesia complications

## 2013-10-11 NOTE — Progress Notes (Signed)
Pt placed on EFM while in PACU. FH 125 with minimal variability, no decels. Baby activity palpated. Pt c/o incisional pain but no uterine cramping or contractions. Pt sleepy but responsive when questioned.

## 2013-10-11 NOTE — Anesthesia Preprocedure Evaluation (Signed)
Anesthesia Evaluation  Patient identified by MRN, date of birth, ID band  Airway       Dental   Pulmonary former smoker,          Cardiovascular hypertension,     Neuro/Psych  Headaches,  Neuromuscular disease negative psych ROS   GI/Hepatic GERD-  Medicated and Controlled,Cholelithiasis   Endo/Other  Morbid obesity  Renal/GU   negative genitourinary   Musculoskeletal  (+) Fibromyalgia -  Abdominal   Peds  Hematology   Anesthesia Other Findings   Reproductive/Obstetrics (+) Pregnancy                           Anesthesia Physical Anesthesia Plan  ASA: III  Anesthesia Plan: General   Post-op Pain Management:    Induction: Intravenous, Rapid sequence and Cricoid pressure planned  Airway Management Planned: Oral ETT  Additional Equipment:   Intra-op Plan:   Post-operative Plan: Extubation in OR  Informed Consent: I have reviewed the patients History and Physical, chart, labs and discussed the procedure including the risks, benefits and alternatives for the proposed anesthesia with the patient or authorized representative who has indicated his/her understanding and acceptance.   Dental advisory given  Plan Discussed with: CRNA, Anesthesiologist and Surgeon  Anesthesia Plan Comments:         Anesthesia Quick Evaluation

## 2013-10-11 NOTE — Discharge Instructions (Signed)
[facility] ______[facility] °LAPAROSCOPIC SURGERY: POST OP INSTRUCTIONS °Always review your discharge instruction sheet given to you by the facility where your surgery was performed. °IF YOU HAVE DISABILITY OR FAMILY LEAVE FORMS, YOU MUST BRING THEM TO THE OFFICE FOR PROCESSING.   °DO NOT GIVE THEM TO YOUR DOCTOR. ° °1. A prescription for pain medication may be given to you upon discharge.  Take your pain medication as prescribed, if needed.  If narcotic pain medicine is not needed, then you may take acetaminophen (Tylenol) or ibuprofen (Advil) as needed. °2. Take your usually prescribed medications unless otherwise directed. °3. If you need a refill on your pain medication, please contact your pharmacy.  They will contact our office to request authorization. Prescriptions will not be filled after 5pm or on week-ends. °4. You should follow a light diet the first few days after arrival home, such as soup and crackers, etc.  Be sure to include lots of fluids daily. °5. Most patients will experience some swelling and bruising in the area of the incisions.  Ice packs will help.  Swelling and bruising can take several days to resolve.  °6. It is common to experience some constipation if taking pain medication after surgery.  Increasing fluid intake and taking a stool softener (such as Colace) will usually help or prevent this problem from occurring.  A mild laxative (Milk of Magnesia or Miralax) should be taken according to package instructions if there are no bowel movements after 48 hours. °7. Unless discharge instructions indicate otherwise, you may remove your bandages 24-48 hours after surgery, and you may shower at that time.  You may have steri-strips (small skin tapes) in place directly over the incision.  These strips should be left on the skin for 7-10 days.  If your surgeon used skin glue on the incision, you may shower in 24 hours.  The glue will flake off over the next 2-3 weeks.  Any sutures or  staples will be removed at the office during your follow-up visit. °8. ACTIVITIES:  You may resume regular (light) daily activities beginning the next day--such as daily self-care, walking, climbing stairs--gradually increasing activities as tolerated.  You may have sexual intercourse when it is comfortable.  Refrain from any heavy lifting or straining until approved by your doctor. °a. You may drive when you are no longer taking prescription pain medication, you can comfortably wear a seatbelt, and you can safely maneuver your car and apply brakes. °b. RETURN TO WORK:  __________________________________________________________ °9. You should see your doctor in the office for a follow-up appointment approximately 2-3 weeks after your surgery.  Make sure that you call for this appointment within a day or two after you arrive home to insure a convenient appointment time. °10. OTHER INSTRUCTIONS: __________________________________________________________________________________________________________________________ __________________________________________________________________________________________________________________________ °WHEN TO CALL YOUR DOCTOR: °1. Fever over 101.0 °2. Inability to urinate °3. Continued bleeding from incision. °4. Increased pain, redness, or drainage from the incision. °5. Increasing abdominal pain ° °The clinic staff is available to answer your questions during regular business hours.  Please don’t hesitate to call and ask to speak to one of the nurses for clinical concerns.  If you have a medical emergency, go to the nearest emergency room or call 911.  A surgeon from [facility] is always on call at the hospital. °[address], [address], [place], [place]  [address] ? [address] [address], [place], [place]   [address] °[phone] ? [phone] ? FAX [phone] °Web site:   [email or web address] °

## 2013-10-11 NOTE — Op Note (Signed)
Laparoscopic Cholecystectomy Procedure Note  Indications: This patient presents with symptomatic gallbladder disease and will undergo laparoscopic cholecystectomy. She is [redacted] weeks pregnant  Pre-operative Diagnosis: Calculus of gallbladder without mention of cholecystitis or obstruction  Post-operative Diagnosis: Same  Surgeon: Abigail MiyamotoBLACKMAN,Jill Petro A   Assistants: 0  Anesthesia: General endotracheal anesthesia  ASA Class: 2  Procedure Details  The patient was seen again in the Holding Room. The risks, benefits, complications, treatment options, and expected outcomes were discussed with the patient. The possibilities of reaction to medication, pulmonary aspiration, perforation of viscus, bleeding, recurrent infection, finding a normal gallbladder, the need for additional procedures, failure to diagnose a condition, the possible need to convert to an open procedure, and creating a complication requiring transfusion or operation were discussed with the patient. The likelihood of improving the patient's symptoms with return to their baseline status is good.  The patient and/or family concurred with the proposed plan, giving informed consent. The site of surgery properly noted. The patient was taken to Operating Room, identified as [name] and the procedure verified as Laparoscopic Cholecystectomy with Intraoperative Cholangiogram. A Time Out was held and the above information confirmed.  Prior to the induction of general anesthesia, antibiotic prophylaxis was administered. General endotracheal anesthesia was then administered and tolerated well. After the induction, the abdomen was prepped with Chloraprep and draped in sterile fashion. The patient was positioned in the supine position.  Local anesthetic agent was injected into the skin near the umbilicus and an incision made. We dissected down to the abdominal fascia with blunt dissection.  The fascia was incised vertically and we entered the  peritoneal cavity bluntly under direct vision to avoid the uterus.  A pursestring suture of 0-Vicryl was placed around the fascial opening.  The Hasson cannula was inserted and secured with the stay suture.  Pneumoperitoneum was then created with CO2 and tolerated well without any adverse changes in the patient's vital signs. An 11-mm port was placed in the subxiphoid position.  Two 5-mm ports were placed in the right upper quadrant. All skin incisions were infiltrated with a local anesthetic agent before making the incision and placing the trocars. The gravid uterus was easily identified  We positioned the patient in reverse Trendelenburg, tilted slightly to the patient's left.  The gallbladder was identified, the fundus grasped and retracted cephalad. Adhesions were lysed bluntly and with the electrocautery where indicated, taking care not to injure any adjacent organs or viscus. The infundibulum was grasped and retracted laterally, exposing the peritoneum overlying the triangle of Calot. This was then divided and exposed in a blunt fashion. The cystic duct was clearly identified and bluntly dissected circumferentially. A critical view of the cystic duct and cystic artery was obtained.  The cystic duct was then ligated with clips and divided. The cystic artery was, dissected free, ligated with clips and divided as well.   The gallbladder was dissected from the liver bed in retrograde fashion with the electrocautery. During the dissection, the gallbladder was entered and multiple tiny stones spilled out.  The gallbladder was removed and placed in an Endocatch sac. The liver bed was irrigated and inspected. Hemostasis was achieved with the electrocautery. Copious irrigation was utilized and was repeatedly aspirated until clear.  The gallbladder and Endocatch sac were then removed through the umbilical port site.  The abdomen was irrigated further a the tiny stones appeared to have been removed.  The pursestring  suture was used to close the umbilical fascia.    We  again inspected the right upper quadrant for hemostasis.  Pneumoperitoneum was released as we removed the trocars.  4-0 Monocryl was used to close the skin.   Benzoin, steri-strips, and clean dressings were applied. The patient was then extubated and brought to the recovery room in stable condition. Instrument, sponge, and needle counts were correct at closure and at the conclusion of the case.   Findings: Chronic cholecystitis with Cholelithiasis  Estimated Blood Loss: Minimal         Drains: 0         Specimens: Gallbladder           Complications: None; patient tolerated the procedure well.         Disposition: PACU - hemodynamically stable.         Condition: stable

## 2013-10-11 NOTE — Addendum Note (Signed)
Addendum created 10/11/13 1752 by Gertie Fey, CRNA   Modules edited: Notes Section   Notes Section:  File: [id number]

## 2013-10-11 NOTE — H&P (Signed)
Chief Complaint   Patient presents with   .  New Evaluation     gallbladder   HPI  [name] is a 33 y.o. female.  HPI  She is referred by Dr. Loreta AveMann. She has known cholelithiasis. She is approximately [redacted] weeks pregnant. Unlike her other pregnancies, she has persistent nausea as well as right upper quadrant abdominal pain hurting due to her back. This occurs after all fatty meals. She has occasional emesis. She is otherwise without complaints.  Past Medical History   Diagnosis  Date   .  GERD (gastroesophageal reflux disease)    .  Indigestion    .  Ectopic pregnancy    .  Ovarian cyst    .  Pregnancy induced hypertension      with first   .  Headache(784.0)    .  Infection      UTI   .  Fibromyalgia      'had it before' no problems in 5-2559yrs   .  Ovarian cyst     Past Surgical History   Procedure  Laterality  Date   .  Cesarean section     .  Therapeutic abortion     .  Cardiac catheterization       not a cardiac problem- really bad acid reflex    Family History   Problem  Relation  Age of Onset   .  Diabetes  Maternal Aunt    .  Cancer  Maternal Aunt      mother's aunt Br Ca   .  Diabetes  Maternal Grandmother    .  Hypertension  Maternal Grandmother    .  Other  Neg Hx    Social History  History   Substance Use Topics   .  Smoking status:  Former Games developermoker   .  Smokeless tobacco:  Never Used      Comment: quit 2009   .  Alcohol Use:  No    Allergies   Allergen  Reactions   .  Latex  Hives, Itching and Rash    Current Outpatient Prescriptions   Medication  Sig  Dispense  Refill   .  acetaminophen (TYLENOL) 325 MG tablet  Take 650 mg by mouth every 6 (six) hours as needed.     .  cetirizine (ZYRTEC) 10 MG tablet  Take 10 mg by mouth every other day.     .  Prenatal Multivit-Min-Fe-FA (PRE-NATAL PO)  Take by mouth.     .  nitrofurantoin, macrocrystal-monohydrate, (MACROBID) 100 MG capsule  Take 1 capsule (100 mg total) by mouth 2 (two) times daily.  10  capsule  0   .  [DISCONTINUED] sodium chloride (OCEAN) 0.65 % nasal spray  Place 1 spray into the nose as needed for congestion.  15 mL  12    No current facility-administered medications for this visit.   Review of Systems  Review of Systems  Constitutional: Negative for fever, chills and unexpected weight change.  HENT: Negative for congestion, hearing loss, sore throat, trouble swallowing and voice change.  Eyes: Negative for visual disturbance.  Respiratory: Negative for cough and wheezing.  Cardiovascular: Negative for chest pain, palpitations and leg swelling.  Gastrointestinal: Positive for nausea, vomiting and abdominal pain. Negative for diarrhea, constipation, blood in stool, abdominal distention and anal bleeding.  Genitourinary: Negative for hematuria, vaginal bleeding and difficulty urinating.  Musculoskeletal: Negative for arthralgias.  Skin: Negative for rash and wound.  Neurological: Negative for  seizures, syncope and headaches.  Hematological: Negative for adenopathy. Does not bruise/bleed easily.  Psychiatric/Behavioral: Negative for confusion.  Blood pressure 130/72, pulse 103, temperature 98 F (36.7 C), resp. rate 18, height 4\' 11"  (1.499 m), weight 219 lb 3.2 oz (99.428 kg), last menstrual period 03/23/2013, unknown if currently breastfeeding.  Physical Exam  Physical Exam  Constitutional: She is oriented to person, place, and time. She appears well-developed and well-nourished. No distress.  HENT:  Head: Normocephalic and atraumatic.  Right Ear: External ear normal.  Left Ear: External ear normal.  Nose: Nose normal.  Mouth/Throat: Oropharynx is clear and moist. No oropharyngeal exudate.  Eyes: Conjunctivae are normal. Pupils are equal, round, and reactive to light. Right eye exhibits no discharge. Left eye exhibits no discharge. No scleral icterus.  Neck: Normal range of motion. Neck supple. No tracheal deviation present.  Cardiovascular: Normal rate, regular  rhythm, normal heart sounds and intact distal pulses.  Pulmonary/Chest: Effort normal and breath sounds normal.  Abdominal: Soft. Bowel sounds are normal. There is tenderness.  She is obese. She is gravid. There is mild tenderness with guarding in the right upper quadrant  Musculoskeletal: Normal range of motion. She exhibits no edema and no tenderness.  Lymphadenopathy:  She has no cervical adenopathy.  Neurological: She is alert and oriented to person, place, and time.  Skin: Skin is warm and dry. No rash noted. She is not diaphoretic. No erythema.  Psychiatric: Her behavior is normal. Judgment normal.  Data Reviewed  I have her ultrasound demonstrating cholelithiasis. The bile duct is normal and there is no gallbladder wall thickening  Assessment  Symptomatic cholelithiasis in pregnancy  Plan  I discussed this with her in detail. I discussed continued expectant management versus laparoscopic cholecystectomy. Given her persistent nausea and RUQ abdominal pain, I think she needs her gallbladder removed now rather than trying to wait until after delivery for fear of possible cholecystitis or pancreatitis which could severely compromise her pregnancy and even result in the need for an open rather than a laparoscopic procedure. I discussed the laparoscopic approach. I discussed the risk of surgery which includes but is not limited to bleeding, infection, injury to surrounding structures, need for conversion to an open procedure, preterm labor, etc. She understands and wishes to proceed with surgery which will be scheduled.

## 2013-10-11 NOTE — Anesthesia Postprocedure Evaluation (Signed)
  Anesthesia Post-op Note  Patient: [name]  Procedure(s) Performed: Procedure(s): LAPAROSCOPIC CHOLECYSTECTOMY (N/A)  Patient Location: Antenatal  Anesthesia Type:General  Level of Consciousness: awake, alert  and oriented  Airway and Oxygen Therapy: Patient Spontanous Breathing  Post-op Pain: none  Post-op Assessment: Post-op Vital signs reviewed and Patient's Cardiovascular Status Stable  Post-op Vital Signs: Reviewed and stable  Last Vitals:  Filed Vitals:   10/11/13 1500  BP:   Pulse: 81  Temp:   Resp:     Complications: No apparent anesthesia complications

## 2013-10-11 NOTE — Anesthesia Postprocedure Evaluation (Signed)
  Anesthesia Post-op Note  Patient: [name]  Procedure(s) Performed: Procedure(s): LAPAROSCOPIC CHOLECYSTECTOMY (N/A)  Patient Location: PACU  Anesthesia Type:General  Level of Consciousness: awake, alert  and oriented  Airway and Oxygen Therapy: Patient Spontanous Breathing  Post-op Pain: none  Post-op Assessment: Post-op Vital signs reviewed, Patient's Cardiovascular Status Stable, Respiratory Function Stable, Patent Airway, No signs of Nausea or vomiting and Pain level controlled  Post-op Vital Signs: Reviewed and stable  Last Vitals:  Filed Vitals:   10/11/13 1315  BP: 110/66  Pulse: 80  Temp: 36.8 C  Resp: 27    Complications: No apparent anesthesia complications

## 2013-10-12 ENCOUNTER — Encounter (HOSPITAL_COMMUNITY): Payer: Self-pay | Admitting: Surgery

## 2013-10-12 DIAGNOSIS — Z3A27 27 weeks gestation of pregnancy: Secondary | ICD-10-CM

## 2013-10-12 MED ORDER — DIPHENHYDRAMINE HCL 25 MG PO CAPS
25.0000 mg | ORAL_CAPSULE | Freq: Four times a day (QID) | ORAL | Status: DC | PRN
  Filled 2013-10-12: qty 1

## 2013-10-12 NOTE — Progress Notes (Signed)
Patient standing and ambulating around room.  Tracing is off and on.

## 2013-10-12 NOTE — Progress Notes (Signed)
Tracing off and on due to patient moving around room and sitting on couch.

## 2013-10-12 NOTE — Progress Notes (Signed)
Pt doing well. NST- reactive. VSSAF-refill. IMP/ doing well post op Plan/ Will discharge to home.

## 2013-10-25 NOTE — Discharge Summary (Signed)
NAMGweneth Dimitri:  Morrow, [name]               ACCOUNT NO.:  [id number]  MEDICAL RECORD NO.:  [id number]  LOCATION:  9158                          FACILITY:  [facility]  PHYSICIAN:  Malva LimesMark Anderson, M.D.    DATE OF BIRTH:  Sep 04, 1980  DATE OF ADMISSION:  10/11/2013 DATE OF DISCHARGE:  10/12/2013                              DISCHARGE SUMMARY   PRINCIPAL DISCHARGE DIAGNOSES: 1. Intrauterine pregnancy at 23 weeks estimated gestational age. 2. Cholelithiasis.  PRINCIPAL PROCEDURE:  Cholecystectomy.  HISTORY OF PRESENT ILLNESS:  [name] is a 33 year old white female, G7, P2-0-4-2, who was admitted by Dr. Magnus IvanBlackman on October 11, 2013, after the patient developed symptomatic cholelithiasis.  She was taken to the operating room where cholecystectomy was performed by Dr. Magnus IvanBlackman.  A complete description of this can be found dictated in the operative note.  The patient's postop course was benign.  On postop day #1, she was ____ out difficulty.  Incisions appeared to be healing well.  She had no complaints and wished to be discharged home.  The baby did well throughout this hospitalization.  She was discharged to home and told to follow up in the office and also with Dr. Magnus IvanBlackman.  She was sent home with pain medication.  The pathology is pending at the time of discharge.          ______________________________ Malva LimesMark Anderson, M.D.     MA/MEDQ  D:  10/25/2013  T:  10/25/2013  Job:  [id number]

## 2013-10-29 ENCOUNTER — Encounter (INDEPENDENT_AMBULATORY_CARE_PROVIDER_SITE_OTHER): Payer: Self-pay | Admitting: Surgery

## 2013-10-29 ENCOUNTER — Ambulatory Visit (INDEPENDENT_AMBULATORY_CARE_PROVIDER_SITE_OTHER): Payer: Medicaid Other | Admitting: Surgery

## 2013-10-29 ENCOUNTER — Encounter (INDEPENDENT_AMBULATORY_CARE_PROVIDER_SITE_OTHER): Payer: Self-pay

## 2013-10-29 VITALS — BP 120/77 | HR 61 | Temp 98.1°F | Resp 16 | Ht 59.0 in | Wt 219.6 lb

## 2013-10-29 DIAGNOSIS — Z09 Encounter for follow-up examination after completed treatment for conditions other than malignant neoplasm: Secondary | ICD-10-CM

## 2013-10-29 NOTE — Progress Notes (Signed)
Subjective:     Patient ID: [name], female   DOB: 10/28/1980, 33 y.o.   MRN: [id number]  HPI She is here for her first postop visit status post laparoscopic cholecystectomy. She is doing well and has no complaints. She has had no issues regarding her pregnancy. She is eating well  Review of Systems     Objective:   Physical Exam On exam, her incisions are well healed. 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 I will see her back as needed

## 2013-11-06 ENCOUNTER — Encounter (INDEPENDENT_AMBULATORY_CARE_PROVIDER_SITE_OTHER): Payer: Self-pay

## 2013-11-16 ENCOUNTER — Emergency Department (INDEPENDENT_AMBULATORY_CARE_PROVIDER_SITE_OTHER)
Admission: EM | Admit: 2013-11-16 | Discharge: 2013-11-16 | Disposition: A | Discharge status: Home / Self Care | Payer: Medicaid Other | Source: Home / Self Care

## 2013-11-16 ENCOUNTER — Encounter (HOSPITAL_COMMUNITY): Payer: Self-pay | Admitting: Emergency Medicine

## 2013-11-16 DIAGNOSIS — T1590XA Foreign body on external eye, part unspecified, unspecified eye, initial encounter: Secondary | ICD-10-CM

## 2013-11-16 DIAGNOSIS — T1591XA Foreign body on external eye, part unspecified, right eye, initial encounter: Secondary | ICD-10-CM

## 2013-11-16 MED ORDER — ERYTHROMYCIN 5 MG/GM OP OINT: TOPICAL_OINTMENT | OPHTHALMIC | Status: DC

## 2013-11-16 NOTE — ED Notes (Signed)
Right eye pain, onset yesterday, eye with discharge and pain

## 2013-11-16 NOTE — Discharge Instructions (Signed)
Thank you for coming in today. Use the erythromycin for a few days.  Come back as needed.    Corneal Abrasion The cornea is the clear covering at the front and center of the eye. When looking at the colored portion of the eye (iris), you are looking through the cornea. This very thin tissue is made up of many layers. The surface layer is a single layer of cells (corneal epithelium) and is one of the most sensitive tissues in the body. If a scratch or injury causes the corneal epithelium to come off, it is called a corneal abrasion. If the injury extends to the tissues below the epithelium, the condition is called a corneal ulcer. CAUSES   Scratches.  Trauma.  Foreign body in the eye. Some people have recurrences of abrasions in the area of the original injury even after it has healed (recurrent erosion syndrome). Recurrent erosion syndrome generally improves and goes away with time. SYMPTOMS   Eye pain.  Difficulty or inability to keep the injured eye open.  The eye becomes very sensitive to light.  Recurrent erosions tend to happen suddenly, first thing in the morning, usually after waking up and opening the eye. DIAGNOSIS  Your health care provider can diagnose a corneal abrasion during an eye exam. Dye is usually placed in the eye using a drop or a small paper strip moistened by your tears. When the eye is examined with a special light, the abrasion shows up clearly because of the dye. TREATMENT   Small abrasions may be treated with antibiotic drops or ointment alone.  A pressure patch may be put over the eye. If this is done, follow your doctor's instructions for when to remove the patch. Do not drive or use machines while the eye patch is on. Judging distances is hard to do with a patch on. If the abrasion becomes infected and spreads to the deeper tissues of the cornea, a corneal ulcer can result. This is serious because it can cause corneal scarring. Corneal scars interfere with  light passing through the cornea and cause a loss of vision in the involved eye. HOME CARE INSTRUCTIONS  Use medicine or ointment as directed. Only take over-the-counter or prescription medicines for pain, discomfort, or fever as directed by your health care provider.  Do not drive or operate machinery if your eye is patched. Your ability to judge distances is impaired.  If your health care provider has given you a follow-up appointment, it is very important to keep that appointment. Not keeping the appointment could result in a severe eye infection or permanent loss of vision. If there is any problem keeping the appointment, let your health care provider know. SEEK MEDICAL CARE IF:   You have pain, light sensitivity, and a scratchy feeling in one eye or both eyes.  Your pressure patch keeps loosening up, and you can blink your eye under the patch after treatment.  Any kind of discharge develops from the eye after treatment or if the lids stick together in the morning.  You have the same symptoms in the morning as you did with the original abrasion days, weeks, or months after the abrasion healed. MAKE SURE YOU:   Understand these instructions.  Will watch your condition.  Will get help right away if you are not doing well or get worse. Document Released: 04/16/2000 Document Revised: 04/24/2013 Document Reviewed: 12/25/2012 [facility] Patient Information 2015 [place], [place]. This information is not intended to replace advice given to you  by your health care provider. Make sure you discuss any questions you have with your health care provider.   Eye, Foreign Body The term foreign body refers to any object near, on the surface of or in the eye that should not be there. A foreign body may be a small speck of dirt or dust, a hair or eyelash, a splinter or any object. CAUSES  Foreign bodies can get in the eye by:  Flying pieces of something that was broken or destroyed (debris).  A  sudden injury (trauma) to the eye. SYMPTOMS  Symptoms depend on what the foreign body is and where it is in the eye. The most common locations are:  On the inner surface of the upper or lower eyelids or on the covering of the white part of the eye (conjunctiva). Symptoms in this location are:  Irritating and painful, especially when blinking.  Feeling like something is in the eye.  On the surface of the clear covering on the front of the eye (cornea). A corneal foreign body has symptoms that:  Are painful and irritating since the cornea is very sensitive.  Form small "rust rings" around a metallic foreign body. Metallic foreign bodies stick more firmly to the surface of the cornea.  Inside the eyeball. Infection can happen fast and can be hard to treat with antibiotics. This is an extremely dangerous situation. Foreign bodies inside the eye can threaten vision. A person may even loose their eye. Foreign bodies inside the eye may cause:  Great pain.  Immediate loss of vision. DIAGNOSIS  Foreign bodies are found during an exam by an eye specialist. Those that are on the eyelids, conjunctiva or cornea are usually (but not always) easily found. When a foreign body is inside the eyeball, a cataract may form almost right away. This makes it hard for an ophthalmologist to find the foreign body. Special tests may be needed, including ultrasound testing, X-rays and CT scans. TREATMENT   Foreign bodies that are on the eyelids, conjunctiva or cornea are often removed easily and painlessly.  If the foreign body has caused a scratch or abrasion of the cornea, antibiotic drops, ointments and/or a tight patch called a "pressure patch" may be needed. Follow-up exams will be needed for several days until the abrasion heals.  Surgery is needed right away if the foreign body is inside the eyeball. This is a medical emergency. An antibiotic therapy will likely be given to stop an infection. HOME CARE  INSTRUCTIONS  The use of eye patches is not universal. Their use varies from state to state and from caregiver to caregiver. If an eye patch was applied:  Keep the eye patch on for as long as directed by your caregiver until the follow-up appointment.  Do not remove the patch to put in medications unless instructed to do so. When replacing the patch, retape it as it was before. Follow the same procedure if the patch becomes loose.  WARNING: Do not drive or operate machinery while the eye is patched. The ability to judge distances will be impaired.  Only take over-the-counter or prescription medicines for pain, discomfort or fever as directed by the caregiver. If no eye patch was applied:  Keep the eye closed as much as possible. Do not rub the eye.  Wear dark glasses as needed to protect the eyes from bright light.  Do not wear contact lenses until the eye feels normal again, or as instructed.  Wear protective eye covering if  there is a risk of eye injury. This is important when working with high speed tools.  Only take over-the-counter or prescription medicines for pain, discomfort or fever as directed by the caregiver. SEEK IMMEDIATE MEDICAL CARE IF:   Pain increases in the eye or the vision changes.  You or your child has problems with the eye patch.  The injury to the eye appears to be getting larger.  There is discharge from the injured eye.  Swelling and/or soreness (inflammation) develops around the affected eye.  You or your child has an oral temperature above 102 F (38.9 C), not controlled by medicine.  Your baby is older than 3 months with a rectal temperature of 102 F (38.9 C) or higher.  Your baby is 4 months old or younger with a rectal temperature of 100.4 F (38 C) or higher. MAKE SURE YOU:   Understand these instructions.  Will watch your condition.  Will get help right away if you are not doing well or get worse. Document Released: 04/19/2005  Document Revised: 07/12/2011 Document Reviewed: 09/14/2012 [facility] Patient Information 2015 [place], [place]. This information is not intended to replace advice given to you by your health care provider. Make sure you discuss any questions you have with your health care provider.

## 2013-11-16 NOTE — ED Provider Notes (Signed)
[name] is a 33 y.o. female who presents to Urgent Care today for eye irritation. Patient had a small bug fly into her right eye 2 days ago. She denies any initial pain. The bug was quickly removed from the eye. She denies any blurry vision or pain. She notes mild eye redness associated with some discharge. She feels well otherwise. Patient is currently about [redacted] weeks pregnant.   Past Medical History  Diagnosis Date  . GERD (gastroesophageal reflux disease)   . Indigestion   . Ectopic pregnancy   . Ovarian cyst   . Headache(784.0)   . Infection     UTI  . Fibromyalgia     'had it before' no problems in 5-2987yrs  . Ovarian cyst   . Pregnancy induced hypertension     with first   History  Substance Use Topics  . Smoking status: Former Games developermoker  . Smokeless tobacco: Never Used     Comment: quit 2009  . Alcohol Use: No   ROS as above Medications: No current facility-administered medications for this encounter.   Current Outpatient Prescriptions  Medication Sig Dispense Refill  . acetaminophen (TYLENOL) 325 MG tablet Take 650 mg by mouth every 6 (six) hours as needed.      . cetirizine (ZYRTEC) 10 MG tablet Take 10 mg by mouth every other day.       . diphenhydrAMINE (BENADRYL) 25 MG tablet Take 25 mg by mouth at bedtime as needed for sleep.      Marland Kitchen. erythromycin ophthalmic ointment Place a 1/2 inch ribbon of ointment into the lower eyelid q 6 hrs for 5-7 days  3.5 g  1  . Prenatal Multivit-Min-Fe-FA (PRE-NATAL PO) Take 1 tablet by mouth 2 (two) times daily. Uses chewable Gummies      . ranitidine (ZANTAC) 150 MG capsule Take 300 mg by mouth 2 (two) times daily.      . [DISCONTINUED] sodium chloride (OCEAN) 0.65 % nasal spray Place 1 spray into the nose as needed for congestion.  15 mL  12    Exam:  BP 127/71  Pulse 110  Temp(Src) 98.6 F (37 C) (Oral)  Resp 18  SpO2 100%  LMP 03/23/2013 Gen: Well NAD HEENT: EOMI,  MMM right eye is normal-appearing with no significant  conjunctival injection. No foreign body or abrasion visualized   No results found for this or any previous visit (from the past 24 hour(s)). No results found.  Assessment and Plan: 33 y.o. female with non-retained foreign body right eye. Plan to treat with erythromycin ointment and careful observation. Followup as needed.  Discussed warning signs or symptoms. Please see discharge instructions. Patient expresses understanding.   This note was created using Conservation officer, historic buildingsDragon voice recognition software. Any transcription errors are unintended.    Rodolph BongEvan S Corey, MD 11/16/13 [phone]

## 2013-11-29 ENCOUNTER — Encounter (HOSPITAL_COMMUNITY): Payer: Self-pay | Admitting: *Deleted

## 2013-11-29 ENCOUNTER — Inpatient Hospital Stay (HOSPITAL_COMMUNITY)
Admission: AD | Admit: 2013-11-29 | Discharge: 2013-11-29 | Disposition: A | Discharge status: Home / Self Care | Payer: Medicaid Other | Source: Ambulatory Visit | Attending: Obstetrics and Gynecology | Admitting: Obstetrics and Gynecology

## 2013-11-29 DIAGNOSIS — O4703 False labor before 37 completed weeks of gestation, third trimester: Secondary | ICD-10-CM

## 2013-11-29 DIAGNOSIS — H53459 Other localized visual field defect, unspecified eye: Secondary | ICD-10-CM

## 2013-11-29 DIAGNOSIS — K3189 Other diseases of stomach and duodenum: Secondary | ICD-10-CM | POA: Insufficient documentation

## 2013-11-29 DIAGNOSIS — O99891 Other specified diseases and conditions complicating pregnancy: Secondary | ICD-10-CM

## 2013-11-29 DIAGNOSIS — O4700 False labor before 37 completed weeks of gestation, unspecified trimester: Secondary | ICD-10-CM | POA: Diagnosis not present

## 2013-11-29 DIAGNOSIS — K219 Gastro-esophageal reflux disease without esophagitis: Secondary | ICD-10-CM | POA: Insufficient documentation

## 2013-11-29 DIAGNOSIS — O9989 Other specified diseases and conditions complicating pregnancy, childbirth and the puerperium: Secondary | ICD-10-CM

## 2013-11-29 DIAGNOSIS — Z87891 Personal history of nicotine dependence: Secondary | ICD-10-CM | POA: Insufficient documentation

## 2013-11-29 DIAGNOSIS — R109 Unspecified abdominal pain: Secondary | ICD-10-CM | POA: Diagnosis present

## 2013-11-29 DIAGNOSIS — E860 Dehydration: Secondary | ICD-10-CM | POA: Insufficient documentation

## 2013-11-29 DIAGNOSIS — R1013 Epigastric pain: Secondary | ICD-10-CM

## 2013-11-29 DIAGNOSIS — H53413 Scotoma involving central area, bilateral: Secondary | ICD-10-CM

## 2013-11-29 LAB — CBC
HCT: 29.5 % — ABNORMAL LOW (ref 36.0–46.0)
Hemoglobin: 9.8 g/dL — ABNORMAL LOW (ref 12.0–15.0)
MCH: 28.5 pg (ref 26.0–34.0)
MCHC: 33.2 g/dL (ref 30.0–36.0)
MCV: 85.8 fL (ref 78.0–100.0)
Platelets: 252 10*3/uL (ref 150–400)
RBC: 3.44 MIL/uL — AB (ref 3.87–5.11)
RDW: 14 % (ref 11.5–15.5)
WBC: 13.3 10*3/uL — ABNORMAL HIGH (ref 4.0–10.5)

## 2013-11-29 LAB — URINALYSIS, ROUTINE W REFLEX MICROSCOPIC
Bilirubin Urine: NEGATIVE
Glucose, UA: 500 mg/dL — AB
Ketones, ur: 15 mg/dL — AB
Leukocytes, UA: NEGATIVE
NITRITE: NEGATIVE
Protein, ur: NEGATIVE mg/dL
UROBILINOGEN UA: 0.2 mg/dL (ref 0.0–1.0)
pH: 5.5 (ref 5.0–8.0)

## 2013-11-29 LAB — URINE MICROSCOPIC-ADD ON

## 2013-11-29 LAB — COMPREHENSIVE METABOLIC PANEL
ALBUMIN: 2.6 g/dL — AB (ref 3.5–5.2)
ALT: 6 U/L (ref 0–35)
AST: 9 U/L (ref 0–37)
Alkaline Phosphatase: 125 U/L — ABNORMAL HIGH (ref 39–117)
Anion gap: 16 — ABNORMAL HIGH (ref 5–15)
BUN: 8 mg/dL (ref 6–23)
CALCIUM: 9.2 mg/dL (ref 8.4–10.5)
CO2: 20 meq/L (ref 19–32)
Chloride: 101 mEq/L (ref 96–112)
Creatinine, Ser: 0.48 mg/dL — ABNORMAL LOW (ref 0.50–1.10)
GFR calc Af Amer: >90 mL/min (ref >90)
GFR calc non Af Amer: >90 mL/min (ref >90)
Glucose, Bld: 152 mg/dL — ABNORMAL HIGH (ref 70–99)
Potassium: 3.4 mEq/L — ABNORMAL LOW (ref 3.7–5.3)
SODIUM: 137 meq/L (ref 137–147)
TOTAL PROTEIN: 6.9 g/dL (ref 6.0–8.3)
Total Bilirubin: <0.2 mg/dL — ABNORMAL LOW (ref 0.3–1.2)

## 2013-11-29 LAB — URIC ACID: URIC ACID, SERUM: 4.6 mg/dL (ref 2.4–7.0)

## 2013-11-29 LAB — LACTATE DEHYDROGENASE: LDH: 130 U/L (ref 94–250)

## 2013-11-29 NOTE — MAU Provider Note (Signed)
Chief Complaint:  Contractions   First Provider Initiated Contact with Patient 11/29/13 2100      HPI: [name] is a 33 y.o. [id number] at [redacted]w[redacted]d who presents to maternity admissions reporting cramping/contractions. She was seen in the office and reported seeing spots in her field of vision intermittently x3 days.  She reports hx of preeclampsia with her first pregnancy.  Blood pressures have been normal throughout this pregnancy.  She reports drinking a sweet tea prior to arrival in MAU.  She reports good fetal movement, denies LOF, vaginal bleeding, vaginal itching/burning, urinary symptoms, h/a, dizziness, n/v, or fever/chills.      Past Medical History: Past Medical History  Diagnosis Date  . GERD (gastroesophageal reflux disease)   . Indigestion   . Ectopic pregnancy   . Ovarian cyst   . Headache(784.0)   . Infection     UTI  . Fibromyalgia     'had it before' no problems in 5-71yrs  . Ovarian cyst   . Pregnancy induced hypertension     with first    Past obstetric history: OB History  Gravida Para Term Preterm AB SAB TAB Ectopic Multiple Living  7 2 2  4 2 1 1  2     # Outcome Date GA Lbr Len/2nd Weight Sex Delivery Anes PTL Lv  7 CUR           6 ECT 01/2009             Comments: mtx  5 TRM 08/01/01 [redacted]w[redacted]d   F VBAC EPI  Y  4 TRM 03/02/99 [redacted]w[redacted]d   M LTCS   Y     Comments: "in labor for 3 days"  3 TAB 03/05/98          2 SAB              Comments: System Generated. Please review and update pregnancy details.  1 SAB               Past Surgical History: Past Surgical History  Procedure Laterality Date  . Cesarean section    . Therapeutic abortion    . Cardiac catheterization      not a cardiac problem- really bad acid reflex  . Cholecystectomy N/A 10/11/2013    Procedure: LAPAROSCOPIC CHOLECYSTECTOMY;  Surgeon: Shelly Rubenstein, MD;  Location: [facility];  Service: General;  Laterality: N/A;    Family History: Family History  Problem Relation Age of Onset  .  Diabetes Maternal Aunt   . Cancer Maternal Aunt     mother's aunt Br Ca  . Diabetes Maternal Grandmother   . Hypertension Maternal Grandmother   . Other Neg Hx     Social History: History  Substance Use Topics  . Smoking status: Former Games developer  . Smokeless tobacco: Never Used     Comment: quit 2009  . Alcohol Use: No    Allergies:  Allergies  Allergen Reactions  . Latex Hives, Itching and Rash    Meds:  Prescriptions prior to admission  Medication Sig Dispense Refill  . acetaminophen (TYLENOL) 325 MG tablet Take 650 mg by mouth every 6 (six) hours as needed.      . cetirizine (ZYRTEC) 10 MG tablet Take 10 mg by mouth every other day.       . Prenatal Multivit-Min-Fe-FA (PRE-NATAL PO) Take 1 tablet by mouth 2 (two) times daily. Uses chewable Gummies      . ranitidine (ZANTAC) 150 MG capsule Take 300 mg  by mouth 2 (two) times daily.        ROS: Pertinent findings in history of present illness.  Physical Exam  Blood pressure 126/72, pulse 120, temperature 99.1 F (37.3 C), temperature source Oral, resp. rate 20, height 4' 9.5" (1.461 m), weight 101.547 kg (223 lb 13.9 oz), last menstrual period 03/23/2013, SpO2 97.00%, unknown if currently breastfeeding. GENERAL: Well-developed, well-nourished female in no acute distress.  HEENT: normocephalic HEART: normal rate RESP: normal effort ABDOMEN: Soft, non-tender, gravid appropriate for gestational age EXTREMITIES: Nontender, no edema NEURO: alert and oriented Dilation: Closed Effacement (%): Thick Cervical Position: Posterior Exam by:: L Leftwich- Kirby  FHT:  Baseline 145, moderate variability, accelerations present, no decelerations Contractions: q 10 mins, irregular   Labs: Results for orders placed during the hospital encounter of 11/29/13 (from the past 24 hour(s))  URINALYSIS, ROUTINE W REFLEX MICROSCOPIC     Status: Abnormal   Collection Time    11/29/13  8:29 PM      Result Value Ref Range   Color, Urine  YELLOW  YELLOW   APPearance CLEAR  CLEAR   Specific Gravity, Urine >1.030 (*) 1.005 - 1.030   pH 5.5  5.0 - 8.0   Glucose, UA 500 (*) NEGATIVE mg/dL   Hgb urine dipstick SMALL (*) NEGATIVE   Bilirubin Urine NEGATIVE  NEGATIVE   Ketones, ur 15 (*) NEGATIVE mg/dL   Protein, ur NEGATIVE  NEGATIVE mg/dL   Urobilinogen, UA 0.2  0.0 - 1.0 mg/dL   Nitrite NEGATIVE  NEGATIVE   Leukocytes, UA NEGATIVE  NEGATIVE  URINE MICROSCOPIC-ADD ON     Status: Abnormal   Collection Time    11/29/13  8:29 PM      Result Value Ref Range   Squamous Epithelial / LPF MANY (*) RARE   WBC, UA 0-2  <3 WBC/hpf   RBC / HPF 0-2  <3 RBC/hpf   Bacteria, UA RARE  RARE   Crystals CA OXALATE CRYSTALS (*) NEGATIVE   Urine-Other MUCOUS PRESENT    CBC     Status: Abnormal   Collection Time    11/29/13  9:13 PM      Result Value Ref Range   WBC 13.3 (*) 4.0 - 10.5 K/uL   RBC 3.44 (*) 3.87 - 5.11 MIL/uL   Hemoglobin 9.8 (*) 12.0 - 15.0 g/dL   HCT 04.529.5 (*) 40.936.0 - 81.146.0 %   MCV 85.8  78.0 - 100.0 fL   MCH 28.5  26.0 - 34.0 pg   MCHC 33.2  30.0 - 36.0 g/dL   RDW 91.414.0  78.211.5 - 95.615.5 %   Platelets 252  150 - 400 K/uL  COMPREHENSIVE METABOLIC PANEL     Status: Abnormal   Collection Time    11/29/13  9:13 PM      Result Value Ref Range   Sodium 137  137 - 147 mEq/L   Potassium 3.4 (*) 3.7 - 5.3 mEq/L   Chloride 101  96 - 112 mEq/L   CO2 20  19 - 32 mEq/L   Glucose, Bld 152 (*) 70 - 99 mg/dL   BUN 8  6 - 23 mg/dL   Creatinine, Ser 2.130.48 (*) 0.50 - 1.10 mg/dL   Calcium 9.2  8.4 - 08.610.5 mg/dL   Total Protein 6.9  6.0 - 8.3 g/dL   Albumin 2.6 (*) 3.5 - 5.2 g/dL   AST 9  0 - 37 U/L   ALT 6  0 - 35 U/L   Alkaline  Phosphatase 125 (*) 39 - 117 U/L   Total Bilirubin <0.2 (*) 0.3 - 1.2 mg/dL   GFR calc non Af Amer >90  >90 mL/min   GFR calc Af Amer >90  >90 mL/min   Anion gap 16 (*) 5 - 15  URIC ACID     Status: None   Collection Time    11/29/13  9:13 PM      Result Value Ref Range   Uric Acid, Serum 4.6  2.4 - 7.0  mg/dL  LACTATE DEHYDROGENASE     Status: None   Collection Time    11/29/13  9:13 PM      Result Value Ref Range   LDH 130  94 - 250 U/L    Assessment: 1. Scotoma, bilateral   2. Threatened preterm labor, third trimester   3. Mild dehydration     Plan: Consult Dr Tenny Craw Discharge home PTL precautions and fetal kick counts Drink plenty of water, reduce sugar/caffeine intake      Follow-up Information   Call Almon Hercules., MD. (tomorrow to make appointment)    Specialty:  Obstetrics and Gynecology   Contact information:   [address] [place] [address] [phone]       Follow up with [facility]ERN[facility] ADMISS[facility]. (As needed for emergencies)    Contact information:   [address] [id number] [place] [address] [phone]       Medication List         acetaminophen 325 MG tablet  Commonly known as:  TYLENOL  Take 650 mg by mouth every 6 (six) hours as needed.     cetirizine 10 MG tablet  Commonly known as:  ZYRTEC  Take 10 mg by mouth every other day.     PRE-NATAL PO  Take 1 tablet by mouth 2 (two) times daily. Uses chewable Gummies     ranitidine 150 MG capsule  Commonly known as:  ZANTAC  Take 300 mg by mouth 2 (two) times daily.        Sharen Counter Certified Nurse-Midwife 11/29/2013 11:16 PM

## 2013-11-29 NOTE — MAU Note (Signed)
Pt says that she was sent from office bc she has been seeing spots and feeling faint. Had preeclampsia with first child. Also feeling contractions. Denies vag bleeding/discharge/LOF. +FM.

## 2013-11-29 NOTE — MAU Note (Signed)
Pt was seen in office for schedule ob appointment. Pt reports seeing spots for 1-2 weeks and contractions for 1-2 weeks.

## 2013-11-29 NOTE — Discharge Instructions (Signed)

## 2013-12-05 ENCOUNTER — Other Ambulatory Visit: Payer: Self-pay | Admitting: Obstetrics and Gynecology

## 2013-12-07 LAB — OB RESULTS CONSOLE GBS: GBS: NEGATIVE

## 2013-12-16 ENCOUNTER — Encounter (HOSPITAL_COMMUNITY): Payer: Self-pay | Admitting: *Deleted

## 2013-12-16 ENCOUNTER — Inpatient Hospital Stay (HOSPITAL_COMMUNITY)
Admission: AD | Admit: 2013-12-16 | Discharge: 2013-12-16 | Disposition: A | Discharge status: Home / Self Care | Payer: Medicaid Other | Source: Ambulatory Visit | Attending: Obstetrics & Gynecology | Admitting: Obstetrics & Gynecology

## 2013-12-16 DIAGNOSIS — R51 Headache: Secondary | ICD-10-CM | POA: Insufficient documentation

## 2013-12-16 DIAGNOSIS — Z87891 Personal history of nicotine dependence: Secondary | ICD-10-CM | POA: Insufficient documentation

## 2013-12-16 DIAGNOSIS — K219 Gastro-esophageal reflux disease without esophagitis: Secondary | ICD-10-CM | POA: Diagnosis not present

## 2013-12-16 DIAGNOSIS — O4700 False labor before 37 completed weeks of gestation, unspecified trimester: Secondary | ICD-10-CM | POA: Diagnosis present

## 2013-12-16 DIAGNOSIS — O99891 Other specified diseases and conditions complicating pregnancy: Secondary | ICD-10-CM | POA: Insufficient documentation

## 2013-12-16 DIAGNOSIS — O26893 Other specified pregnancy related conditions, third trimester: Secondary | ICD-10-CM

## 2013-12-16 DIAGNOSIS — O9989 Other specified diseases and conditions complicating pregnancy, childbirth and the puerperium: Secondary | ICD-10-CM

## 2013-12-16 LAB — COMPREHENSIVE METABOLIC PANEL
ALBUMIN: 2.8 g/dL — AB (ref 3.5–5.2)
ALT: 6 U/L (ref 0–35)
ANION GAP: 13 (ref 5–15)
AST: 9 U/L (ref 0–37)
Alkaline Phosphatase: 150 U/L — ABNORMAL HIGH (ref 39–117)
BUN: 7 mg/dL (ref 6–23)
CALCIUM: 9.4 mg/dL (ref 8.4–10.5)
CHLORIDE: 100 meq/L (ref 96–112)
CO2: 22 meq/L (ref 19–32)
Creatinine, Ser: 0.59 mg/dL (ref 0.50–1.10)
GFR calc Af Amer: >90 mL/min (ref >90)
Glucose, Bld: 83 mg/dL (ref 70–99)
Potassium: 4.2 mEq/L (ref 3.7–5.3)
Sodium: 135 mEq/L — ABNORMAL LOW (ref 137–147)
Total Protein: 6.8 g/dL (ref 6.0–8.3)

## 2013-12-16 LAB — URINALYSIS, ROUTINE W REFLEX MICROSCOPIC
Bilirubin Urine: NEGATIVE
GLUCOSE, UA: NEGATIVE mg/dL
Hgb urine dipstick: NEGATIVE
KETONES UR: 40 mg/dL — AB
Leukocytes, UA: NEGATIVE
NITRITE: NEGATIVE
PROTEIN: NEGATIVE mg/dL
Specific Gravity, Urine: 1.02 (ref 1.005–1.030)
Urobilinogen, UA: 0.2 mg/dL (ref 0.0–1.0)
pH: 6 (ref 5.0–8.0)

## 2013-12-16 LAB — CBC
HEMATOCRIT: 30.6 % — AB (ref 36.0–46.0)
Hemoglobin: 10 g/dL — ABNORMAL LOW (ref 12.0–15.0)
MCH: 27.9 pg (ref 26.0–34.0)
MCHC: 32.7 g/dL (ref 30.0–36.0)
MCV: 85.5 fL (ref 78.0–100.0)
PLATELETS: 248 10*3/uL (ref 150–400)
RBC: 3.58 MIL/uL — ABNORMAL LOW (ref 3.87–5.11)
RDW: 14.5 % (ref 11.5–15.5)
WBC: 11.8 10*3/uL — ABNORMAL HIGH (ref 4.0–10.5)

## 2013-12-16 MED ORDER — ACETAMINOPHEN 500 MG PO TABS
1000.0000 mg | ORAL_TABLET | Freq: Once | ORAL | Status: AC
  Administered 2013-12-16: 1000 mg via ORAL
  Filled 2013-12-16: qty 2

## 2013-12-16 NOTE — MAU Provider Note (Signed)
Reviewed case with CNM and I agree with above  Charisse Wendell STACIA  

## 2013-12-16 NOTE — MAU Provider Note (Signed)
History     CSN: [id number]  Arrival date and time: 12/16/13 4098   First Provider Initiated Contact with Patient 12/16/13 2011      Chief Complaint  Patient presents with  . Contractions  . Dizziness   HPI  [name] is a 33 y.o. [id number] at [redacted]w[redacted]d who presents today with contractions, headache and lightheadedness. She states that she started to get a headache around 1700. She rates her pain 3/10, and she has not taken anything for it at this time. She rates the pain from her contractions 5/10. She denies any vaginal bleeding or LOF and confirms fetal movement. She states that she has not had any problems with this pregnancy, but she did have pre-eclampsia with her first pregnancy. She did not have pre-eclampsia with her second pregnancy.   Past Medical History  Diagnosis Date  . GERD (gastroesophageal reflux disease)   . Indigestion   . Ectopic pregnancy   . Headache(784.0)   . Infection     UTI  . Fibromyalgia     'had it before' no problems in 5-15yrs  . Pregnancy induced hypertension     with first    Past Surgical History  Procedure Laterality Date  . Cesarean section    . Therapeutic abortion    . Cardiac catheterization      not a cardiac problem- really bad acid reflex  . Cholecystectomy N/A 10/11/2013    Procedure: LAPAROSCOPIC CHOLECYSTECTOMY;  Surgeon: Shelly Rubenstein, MD;  Location: [facility];  Service: General;  Laterality: N/A;  . Cardiac catheterization  2006    Family History  Problem Relation Age of Onset  . Diabetes Maternal Aunt   . Cancer Maternal Aunt     mother's aunt Br Ca  . Diabetes Maternal Grandmother   . Hypertension Maternal Grandmother   . Other Neg Hx     History  Substance Use Topics  . Smoking status: Former Games developer  . Smokeless tobacco: Never Used     Comment: quit 2009  . Alcohol Use: No    Allergies:  Allergies  Allergen Reactions  . Latex Hives, Itching and Rash    Prescriptions prior to admission  Medication  Sig Dispense Refill  . acetaminophen (TYLENOL) 325 MG tablet Take 650 mg by mouth every 6 (six) hours as needed for mild pain or headache.       . cetirizine (ZYRTEC) 10 MG tablet Take 10 mg by mouth daily as needed for allergies or rhinitis.       . Prenatal Vit-Min-FA-Fish Oil ([employer] PRENATAL GUMMY PO) Take 1 each by mouth 2 (two) times daily.      . ranitidine (ZANTAC) 150 MG tablet Take 300 mg by mouth 2 (two) times daily.        ROS Physical Exam   Last menstrual period 03/23/2013, unknown if currently breastfeeding.  Physical Exam  Nursing note and vitals reviewed. Constitutional: She is oriented to person, place, and time. She appears well-developed and well-nourished. No distress.  Cardiovascular: Normal rate.   Respiratory: Effort normal.  GI: Soft. There is no tenderness. There is no rebound.  Neurological: She is alert and oriented to person, place, and time.  Skin: Skin is warm and dry.  Psychiatric: She has a normal mood and affect.   FHT 130, moderate with 15x15 accles, no decels Toco: irregular UCs Cervix: 1/thick/high/posterior  MAU Course  Procedures  Results for orders placed during the hospital encounter of 12/16/13 (from the past 24 hour(s))  CBC     Status: Abnormal   Collection Time    12/16/13  8:20 PM      Result Value Ref Range   WBC 11.8 (*) 4.0 - 10.5 K/uL   RBC 3.58 (*) 3.87 - 5.11 MIL/uL   Hemoglobin 10.0 (*) 12.0 - 15.0 g/dL   HCT 78.230.6 (*) 95.636.0 - 21.346.0 %   MCV 85.5  78.0 - 100.0 fL   MCH 27.9  26.0 - 34.0 pg   MCHC 32.7  30.0 - 36.0 g/dL   RDW 08.614.5  57.811.5 - 46.915.5 %   Platelets 248  150 - 400 K/uL  COMPREHENSIVE METABOLIC PANEL     Status: Abnormal   Collection Time    12/16/13  8:20 PM      Result Value Ref Range   Sodium 135 (*) 137 - 147 mEq/L   Potassium 4.2  3.7 - 5.3 mEq/L   Chloride 100  96 - 112 mEq/L   CO2 22  19 - 32 mEq/L   Glucose, Bld 83  70 - 99 mg/dL   BUN 7  6 - 23 mg/dL   Creatinine, Ser 6.290.59  0.50 - 1.10 mg/dL   Calcium  9.4  8.4 - 52.810.5 mg/dL   Total Protein 6.8  6.0 - 8.3 g/dL   Albumin 2.8 (*) 3.5 - 5.2 g/dL   AST 9  0 - 37 U/L   ALT 6  0 - 35 U/L   Alkaline Phosphatase 150 (*) 39 - 117 U/L   Total Bilirubin <0.2 (*) 0.3 - 1.2 mg/dL   GFR calc non Af Amer >90  >90 mL/min   GFR calc Af Amer >90  >90 mL/min   Anion gap 13  5 - 15   2200: patient reports that her headache has improved with the tylenol.  2234: D/W Dr. Mora ApplPinn, ok for dc home. FU with the office this week. Do not need to get a U/P:C with normal blood pressures   Assessment and Plan   1. Headache in pregnancy, antepartum, third trimester    Third trimester precautions Fetal kick counts Labor signs reviewed  Return to MAU as needed FU with the office as planned for weekly NSTs  Follow-up Information   Follow up with [facility] In 1 week.   Contact information:   [address] [place] [id number] [phone]      Tawnya CrookHogan, Chyla Schlender Donovan 12/16/2013, 8:16 PM

## 2013-12-16 NOTE — MAU Note (Signed)
Pt states that she has been feeling dizzy and like she is going to pass out. States that contractions started this morning. Denies vaginal bleeding and SROM. States positive fetal movement. C/O nausea, denies vomiting or diarrhea. States vaginal pressure and pain in her back.

## 2013-12-16 NOTE — Discharge Instructions (Signed)
Third Trimester of Pregnancy The third trimester is from week 29 through week 42, months 7 through 9. The third trimester is a time when the fetus is growing rapidly. At the end of the ninth month, the fetus is about 20 inches in length and weighs 6-10 pounds.  BODY CHANGES Your body goes through many changes during pregnancy. The changes vary from woman to woman.   Your weight will continue to increase. You can expect to gain 25-35 pounds (11-16 kg) by the end of the pregnancy.  You may begin to get stretch marks on your hips, abdomen, and breasts.  You may urinate more often because the fetus is moving lower into your pelvis and pressing on your bladder.  You may develop or continue to have heartburn as a result of your pregnancy.  You may develop constipation because certain hormones are causing the muscles that push waste through your intestines to slow down.  You may develop hemorrhoids or swollen, bulging veins (varicose veins).  You may have pelvic pain because of the weight gain and pregnancy hormones relaxing your joints between the bones in your pelvis. Backaches may result from overexertion of the muscles supporting your posture.  You may have changes in your hair. These can include thickening of your hair, rapid growth, and changes in texture. Some women also have hair loss during or after pregnancy, or hair that feels dry or thin. Your hair will most likely return to normal after your baby is born.  Your breasts will continue to grow and be tender. A yellow discharge may leak from your breasts called colostrum.  Your belly button may stick out.  You may feel short of breath because of your expanding uterus.  You may notice the fetus "dropping," or moving lower in your abdomen.  You may have a bloody mucus discharge. This usually occurs a few days to a week before labor begins.  Your cervix becomes thin and soft (effaced) near your due date. WHAT TO EXPECT AT YOUR PRENATAL  EXAMS  You will have prenatal exams every 2 weeks until week 36. Then, you will have weekly prenatal exams. During a routine prenatal visit:  You will be weighed to make sure you and the fetus are growing normally.  Your blood pressure is taken.  Your abdomen will be measured to track your baby's growth.  The fetal heartbeat will be listened to.  Any test results from the previous visit will be discussed.  You may have a cervical check near your due date to see if you have effaced. At around 36 weeks, your caregiver will check your cervix. At the same time, your caregiver will also perform a test on the secretions of the vaginal tissue. This test is to determine if a type of bacteria, Group B streptococcus, is present. Your caregiver will explain this further. Your caregiver may ask you:  What your birth plan is.  How you are feeling.  If you are feeling the baby move.  If you have had any abnormal symptoms, such as leaking fluid, bleeding, severe headaches, or abdominal cramping.  If you have any questions. Other tests or screenings that may be performed during your third trimester include:  Blood tests that check for low iron levels (anemia).  Fetal testing to check the health, activity level, and growth of the fetus. Testing is done if you have certain medical conditions or if there are problems during the pregnancy. FALSE LABOR You may feel small, irregular contractions that   eventually go away. These are called Braxton Hicks contractions, or false labor. Contractions may last for hours, days, or even weeks before true labor sets in. If contractions come at regular intervals, intensify, or become painful, it is best to be seen by your caregiver.  SIGNS OF LABOR   Menstrual-like cramps.  Contractions that are 5 minutes apart or less.  Contractions that start on the top of the uterus and spread down to the lower abdomen and back.  A sense of increased pelvic pressure or back  pain.  A watery or bloody mucus discharge that comes from the vagina. If you have any of these signs before the 37th week of pregnancy, call your caregiver right away. You need to go to the hospital to get checked immediately. HOME CARE INSTRUCTIONS   Avoid all smoking, herbs, alcohol, and unprescribed drugs. These chemicals affect the formation and growth of the baby.  Follow your caregiver's instructions regarding medicine use. There are medicines that are either safe or unsafe to take during pregnancy.  Exercise only as directed by your caregiver. Experiencing uterine cramps is a good sign to stop exercising.  Continue to eat regular, healthy meals.  Wear a good support bra for breast tenderness.  Do not use hot tubs, steam rooms, or saunas.  Wear your seat belt at all times when driving.  Avoid raw meat, uncooked cheese, cat litter boxes, and soil used by cats. These carry germs that can cause birth defects in the baby.  Take your prenatal vitamins.  Try taking a stool softener (if your caregiver approves) if you develop constipation. Eat more high-fiber foods, such as fresh vegetables or fruit and whole grains. Drink plenty of fluids to keep your urine clear or pale yellow.  Take warm sitz baths to soothe any pain or discomfort caused by hemorrhoids. Use hemorrhoid cream if your caregiver approves.  If you develop varicose veins, wear support hose. Elevate your feet for 15 minutes, 3-4 times a day. Limit salt in your diet.  Avoid heavy lifting, wear low heal shoes, and practice good posture.  Rest a lot with your legs elevated if you have leg cramps or low back pain.  Visit your dentist if you have not gone during your pregnancy. Use a soft toothbrush to brush your teeth and be gentle when you floss.  A sexual relationship may be continued unless your caregiver directs you otherwise.  Do not travel far distances unless it is absolutely necessary and only with the approval  of your caregiver.  Take prenatal classes to understand, practice, and ask questions about the labor and delivery.  Make a trial run to the hospital.  Pack your hospital bag.  Prepare the baby's nursery.  Continue to go to all your prenatal visits as directed by your caregiver. SEEK MEDICAL CARE IF:  You are unsure if you are in labor or if your water has broken.  You have dizziness.  You have mild pelvic cramps, pelvic pressure, or nagging pain in your abdominal area.  You have persistent nausea, vomiting, or diarrhea.  You have a bad smelling vaginal discharge.  You have pain with urination. SEEK IMMEDIATE MEDICAL CARE IF:   You have a fever.  You are leaking fluid from your vagina.  You have spotting or bleeding from your vagina.  You have severe abdominal cramping or pain.  You have rapid weight loss or gain.  You have shortness of breath with chest pain.  You notice sudden or extreme swelling   of your face, hands, ankles, feet, or legs.  You have not felt your baby move in over an hour.  You have severe headaches that do not go away with medicine.  You have vision changes. Document Released: 04/13/2001 Document Revised: 04/24/2013 Document Reviewed: 06/20/2012 Exit[employer] Patient Information 2015 [employer]. This information is not intended to replace advice given to you by your health care provider. Make sure you discuss any questions you have with your health care provider.  

## 2013-12-18 ENCOUNTER — Encounter (HOSPITAL_COMMUNITY): Payer: Self-pay | Admitting: *Deleted

## 2013-12-18 ENCOUNTER — Inpatient Hospital Stay (HOSPITAL_COMMUNITY): Payer: Medicaid Other

## 2013-12-18 ENCOUNTER — Inpatient Hospital Stay (HOSPITAL_COMMUNITY)
Admission: AD | Admit: 2013-12-18 | Discharge: 2013-12-18 | Disposition: A | Discharge status: Home / Self Care | Payer: Medicaid Other | Source: Ambulatory Visit | Attending: Obstetrics and Gynecology | Admitting: Obstetrics and Gynecology

## 2013-12-18 DIAGNOSIS — Z91040 Latex allergy status: Secondary | ICD-10-CM | POA: Diagnosis not present

## 2013-12-18 DIAGNOSIS — K219 Gastro-esophageal reflux disease without esophagitis: Secondary | ICD-10-CM | POA: Insufficient documentation

## 2013-12-18 DIAGNOSIS — O288 Other abnormal findings on antenatal screening of mother: Secondary | ICD-10-CM

## 2013-12-18 DIAGNOSIS — Z87891 Personal history of nicotine dependence: Secondary | ICD-10-CM | POA: Diagnosis not present

## 2013-12-18 DIAGNOSIS — O368390 Maternal care for abnormalities of the fetal heart rate or rhythm, unspecified trimester, not applicable or unspecified: Secondary | ICD-10-CM | POA: Insufficient documentation

## 2013-12-18 NOTE — Discharge Instructions (Signed)

## 2013-12-18 NOTE — MAU Note (Signed)
Patient was seen in the office today for a regular visit. Had a non reactive NST in the office and sent to MAU for further monitoring.

## 2013-12-18 NOTE — MAU Provider Note (Signed)
History     CSN: [id number]  Arrival date and time: 12/18/13 1124   First Provider Initiated Contact with Patient 12/18/13 1224      Chief Complaint  Patient presents with  . Non-stress Test   HPI [name] 33 y.W.[id number] [redacted]w[redacted]d presents to MAU after havign a nonreactive NST in the office this morning.  She states she does feel baby move.  She is not having any contractions, no bleeding, leaking of fluid or dysuria.   OB History   Grav Para Term Preterm Abortions TAB SAB Ect Mult Living   7 2 2  4 1 2 1  2       Past Medical History  Diagnosis Date  . GERD (gastroesophageal reflux disease)   . Indigestion   . Ectopic pregnancy   . Headache(784.0)   . Infection     UTI  . Fibromyalgia     'had it before' no problems in 5-71yrs  . Pregnancy induced hypertension     with first    Past Surgical History  Procedure Laterality Date  . Cesarean section    . Therapeutic abortion    . Cardiac catheterization      not a cardiac problem- really bad acid reflex  . Cholecystectomy N/A 10/11/2013    Procedure: LAPAROSCOPIC CHOLECYSTECTOMY;  Surgeon: Shelly Rubenstein, MD;  Location: [facility];  Service: General;  Laterality: N/A;  . Cardiac catheterization  2006    Family History  Problem Relation Age of Onset  . Diabetes Maternal Aunt   . Cancer Maternal Aunt     mother's aunt Br Ca  . Diabetes Maternal Grandmother   . Hypertension Maternal Grandmother   . Heart disease Maternal Grandmother   . Other Neg Hx     History  Substance Use Topics  . Smoking status: Former Games developer  . Smokeless tobacco: Never Used     Comment: quit 2009  . Alcohol Use: No    Allergies:  Allergies  Allergen Reactions  . Latex Hives, Itching and Rash    Prescriptions prior to admission  Medication Sig Dispense Refill  . acetaminophen (TYLENOL) 325 MG tablet Take 650 mg by mouth every 6 (six) hours as needed for mild pain or headache.       . cetirizine (ZYRTEC) 10 MG tablet Take 10  mg by mouth daily as needed for allergies or rhinitis.       . Prenatal Vit-Min-FA-Fish Oil ([employer]S PRENATAL GUMMY PO) Take 1 each by mouth 2 (two) times daily.      . ranitidine (ZANTAC) 150 MG tablet Take 300 mg by mouth 2 (two) times daily.        ROS Pertinent ROS included in HPI Physical Exam   Blood pressure 123/67, pulse 93, temperature 98.3 F (36.8 C), temperature source Oral, resp. rate 18, last menstrual period 03/23/2013, unknown if currently breastfeeding.  Physical Exam  Constitutional: She is oriented to person, place, and time. She appears well-developed and well-nourished. No distress.  HENT:  Head: Normocephalic and atraumatic.  Eyes: EOM are normal.  Neck: Normal range of motion.  Cardiovascular: Normal rate, regular rhythm and normal heart sounds.   Respiratory: Effort normal and breath sounds normal. No respiratory distress.  GI: Soft. Bowel sounds are normal. There is no tenderness.  Neurological: She is alert and oriented to person, place, and time.  Skin: Skin is warm and dry.  Psychiatric: She has a normal mood and affect.    MAU Course  Procedures  NST  BPP  MDM   Assessment and Plan  Assessment: 36 weeks, stable   Plan: Nurse called Dr. Claiborne Billingsallahan for disposition.  Pt to be discharged to home with follow up.     Bertram Denvereague Clark, Karen E 12/18/2013, 12:25 PM

## 2013-12-20 ENCOUNTER — Encounter (HOSPITAL_COMMUNITY): Payer: Self-pay | Admitting: *Deleted

## 2013-12-20 ENCOUNTER — Inpatient Hospital Stay (HOSPITAL_COMMUNITY)
Admission: AD | Admit: 2013-12-20 | Discharge: 2013-12-20 | Disposition: A | Discharge status: Home / Self Care | Payer: Medicaid Other | Source: Ambulatory Visit | Attending: Obstetrics | Admitting: Obstetrics

## 2013-12-20 DIAGNOSIS — O368990 Maternal care for other specified fetal problems, unspecified trimester, not applicable or unspecified: Secondary | ICD-10-CM

## 2013-12-20 DIAGNOSIS — O368390 Maternal care for abnormalities of the fetal heart rate or rhythm, unspecified trimester, not applicable or unspecified: Secondary | ICD-10-CM | POA: Insufficient documentation

## 2013-12-20 DIAGNOSIS — R1013 Epigastric pain: Secondary | ICD-10-CM

## 2013-12-20 DIAGNOSIS — K219 Gastro-esophageal reflux disease without esophagitis: Secondary | ICD-10-CM | POA: Insufficient documentation

## 2013-12-20 DIAGNOSIS — K3189 Other diseases of stomach and duodenum: Secondary | ICD-10-CM | POA: Diagnosis not present

## 2013-12-20 DIAGNOSIS — Z87891 Personal history of nicotine dependence: Secondary | ICD-10-CM | POA: Insufficient documentation

## 2013-12-20 NOTE — MAU Provider Note (Signed)
History     CSN: [id number]  Arrival date and time: 12/20/13 1223   First Provider Initiated Contact with Patient 12/20/13 1315      Chief Complaint  Patient presents with  . fh dropping with contractions    HPIpt is [id number] pregnant [id number] sent from office for monitoring- pt had late decelerations at the office. Pt denies spotting, bleeding, or LOF  Nurses note: Set from office for prolonged monitoring. No bleeding or leaking. Is contracting.   Past Medical History  Diagnosis Date  . GERD (gastroesophageal reflux disease)   . Indigestion   . Ectopic pregnancy   . Headache(784.0)   . Infection     UTI  . Fibromyalgia     'had it before' no problems in 5-39108yrs  . Pregnancy induced hypertension     with first    Past Surgical History  Procedure Laterality Date  . Cesarean section    . Therapeutic abortion    . Cardiac catheterization      not a cardiac problem- really bad acid reflex  . Cholecystectomy N/A 10/11/2013    Procedure: LAPAROSCOPIC CHOLECYSTECTOMY;  Surgeon: Shelly Rubensteinouglas A Blackman, MD;  Location: [facility];  Service: General;  Laterality: N/A;  . Cardiac catheterization  2006    Family History  Problem Relation Age of Onset  . Diabetes Maternal Aunt   . Cancer Maternal Aunt     mother's aunt Br Ca  . Diabetes Maternal Grandmother   . Hypertension Maternal Grandmother   . Heart disease Maternal Grandmother   . Other Neg Hx     History  Substance Use Topics  . Smoking status: Former Games developermoker  . Smokeless tobacco: Never Used     Comment: quit 2009  . Alcohol Use: No    Allergies:  Allergies  Allergen Reactions  . Latex Hives, Itching and Rash    Prescriptions prior to admission  Medication Sig Dispense Refill  . acetaminophen (TYLENOL) 325 MG tablet Take 650 mg by mouth every 6 (six) hours as needed for mild pain or headache.       . cetirizine (ZYRTEC) 10 MG tablet Take 10 mg by mouth daily as needed for allergies or rhinitis.       . Prenatal  Vit-Min-FA-Fish Oil ([employer] PRENATAL GUMMY PO) Take 1 each by mouth 2 (two) times daily.      . ranitidine (ZANTAC) 150 MG tablet Take 300 mg by mouth 2 (two) times daily.        Review of Systems  Constitutional: Negative for fever and chills.  Gastrointestinal: Negative for vomiting and abdominal pain.  Genitourinary: Negative for dysuria and urgency.  Neurological: Negative for headaches.   Physical Exam   Blood pressure 114/67, pulse 116, resp. rate 18, height 4\' 11"  (1.499 m), weight 222 lb (100.699 kg), last menstrual period 03/23/2013, unknown if currently breastfeeding.  Physical Exam  Nursing note and vitals reviewed. Constitutional: She is oriented to person, place, and time. She appears well-developed and well-nourished. No distress.  HENT:  Head: Normocephalic.  Eyes: Pupils are equal, round, and reactive to light.  Neck: Normal range of motion. Neck supple.  Cardiovascular: Normal rate.   Respiratory: Effort normal.  GI: Soft. She exhibits no distension. There is no tenderness. There is no rebound and no guarding.  occ ctx  FHR had one variable deceleration with ctx; FHR episodically reactive  Musculoskeletal: Normal range of motion.  Neurological: She is alert and oriented to person, place, and time.  Skin: Skin  is warm and dry.    MAU Course  Procedures Reviewed FHR monitoring strip with Dr. Chestine Spore- pt may be d/c home No results found for this or any previous visit (from the past 24 hour(s)).   Assessment and Plan  Fetal variable deceleration F/u with scheduled OB appointment Kick counts  LINEBERRY,SUSAN 12/20/2013, 1:16 PM

## 2013-12-20 NOTE — MAU Note (Signed)
Set from office for prolonged monitoring.  No bleeding or leaking.  Is contracting.

## 2013-12-24 NOTE — MAU Provider Note (Signed)
Reviewed FHR tracing w Pamelia Hoit NP. Reactive NST.  No late decelerations.   Fetal status reassuring.

## 2013-12-25 ENCOUNTER — Inpatient Hospital Stay (HOSPITAL_COMMUNITY)
Admission: AD | Admit: 2013-12-25 | Discharge: 2013-12-25 | Disposition: A | Discharge status: Home / Self Care | Payer: Medicaid Other | Source: Ambulatory Visit | Attending: Obstetrics and Gynecology | Admitting: Obstetrics and Gynecology

## 2013-12-25 ENCOUNTER — Encounter (HOSPITAL_COMMUNITY): Payer: Self-pay | Admitting: *Deleted

## 2013-12-25 DIAGNOSIS — K219 Gastro-esophageal reflux disease without esophagitis: Secondary | ICD-10-CM | POA: Insufficient documentation

## 2013-12-25 DIAGNOSIS — Z87891 Personal history of nicotine dependence: Secondary | ICD-10-CM | POA: Diagnosis not present

## 2013-12-25 DIAGNOSIS — O368190 Decreased fetal movements, unspecified trimester, not applicable or unspecified: Secondary | ICD-10-CM | POA: Diagnosis present

## 2013-12-25 DIAGNOSIS — Z3689 Encounter for other specified antenatal screening: Secondary | ICD-10-CM

## 2013-12-25 DIAGNOSIS — O139 Gestational [pregnancy-induced] hypertension without significant proteinuria, unspecified trimester: Secondary | ICD-10-CM

## 2013-12-25 DIAGNOSIS — O10019 Pre-existing essential hypertension complicating pregnancy, unspecified trimester: Secondary | ICD-10-CM | POA: Diagnosis not present

## 2013-12-25 NOTE — MAU Provider Note (Signed)
History     CSN: [id number]  Arrival date and time: 12/25/13 1716   First Provider Initiated Contact with Patient 12/25/13 1756      Chief Complaint  Patient presents with  . Decreased Fetal Movement  . Hypertension   HPI [name] 33 y.o. [id number] at [redacted]w[redacted]d presents to MAU for extended fetal monitoring due to nonreactive NST in the office earlier today.  She reports irregular contractions and some fetal movement, although this is somewhat decreased at present.  She denies bleeding, LOF, severe abdominal pain, dysuria.   OB History   Grav Para Term Preterm Abortions TAB SAB Ect Mult Living   Past Medical History  Diagnosis Date  . GERD (gastroesophageal reflux disease)   . Indigestion   . Ectopic pregnancy   . Headache(784.0)   . Infection     UTI  . Fibromyalgia     'had it before' no problems in 5-25yrs  . Pregnancy induced hypertension     with first    Past Surgical History  Procedure Laterality Date  . Cesarean section    . Therapeutic abortion    . Cardiac catheterization      not a cardiac problem- really bad acid reflex  . Cholecystectomy N/A 10/11/2013    Procedure: LAPAROSCOPIC CHOLECYSTECTOMY;  Surgeon: Shelly Rubenstein, MD;  Location: [facility];  Service: General;  Laterality: N/A;  . Cardiac catheterization  2006    Family History  Problem Relation Age of Onset  . Diabetes Maternal Aunt   . Cancer Maternal Aunt     mother's aunt Br Ca  . Diabetes Maternal Grandmother   . Hypertension Maternal Grandmother   . Heart disease Maternal Grandmother   . Other Neg Hx     History  Substance Use Topics  . Smoking status: Former Games developer  . Smokeless tobacco: Never Used     Comment: quit 2009  . Alcohol Use: No    Allergies:  Allergies  Allergen Reactions  . Latex Hives, Itching and Rash    Prescriptions prior to admission  Medication Sig Dispense Refill  . acetaminophen (TYLENOL) 325 MG tablet Take 650 mg by mouth  every 6 (six) hours as needed for mild pain or headache.       . cetirizine (ZYRTEC) 10 MG tablet Take 10 mg by mouth daily as needed for allergies or rhinitis.       . Prenatal Vit-Min-FA-Fish Oil ([employer] PRENATAL GUMMY PO) Take 1 each by mouth 2 (two) times daily.      . ranitidine (ZANTAC) 150 MG tablet Take 300 mg by mouth 2 (two) times daily.        ROS Pertinent ROS in HPI  Physical Exam   Blood pressure 122/72, pulse 110, temperature 98.4 F (36.9 C), temperature source Oral, resp. rate 20, last menstrual period 03/23/2013, unknown if currently breastfeeding.  Physical Exam  Constitutional: She is oriented to person, place, and time. She appears well-developed and well-nourished. No distress.  HENT:  Head: Normocephalic and atraumatic.  Eyes: EOM are normal.  Neck: Normal range of motion.  Cardiovascular: Normal rate and regular rhythm.   Respiratory: Effort normal and breath sounds normal. No respiratory distress.  GI: Soft. She exhibits no distension. There is no tenderness.  Musculoskeletal: Normal range of motion.  Neurological: She is alert and oriented to person, place, and time.  Skin: Skin is warm and dry.  Psychiatric: She has a normal mood and affect. Her behavior is normal.    MAU Course  Procedures NST Fetal Tracing:  Baseline:120's Variability:mod Accelerations: 15x15 Decelerations:none  Toco:irregular    MDM Discussed with Dr. Fredia Sorrow.  Reactive NST.  Good fetal movement.  Otherwise asymptomatic.  Okay to discharge to home.  Assessment and Plan  Assessment: 37 weeks, Reactive NST  Plan: Discharge to home Labor precautions - return to MAU Follow up as scheduled in office MAU for emergencies  [name] 12/25/2013, 6:52 PM

## 2013-12-25 NOTE — Discharge Instructions (Signed)
Fetal Movement Counts °Patient Name: __________________________________________________ Patient Due Date: ____________________ °Performing a fetal movement count is highly recommended in high-risk pregnancies, but it is good for every pregnant woman to do. Your health care provider may ask you to start counting fetal movements at 28 weeks of the pregnancy. Fetal movements often increase: °· After eating a full meal. °· After physical activity. °· After eating or drinking something sweet or cold. °· At rest. °Pay attention to when you feel the baby is most active. This will help you notice a pattern of your baby's sleep and wake cycles and what factors contribute to an increase in fetal movement. It is important to perform a fetal movement count at the same time each day when your baby is normally most active.  °HOW TO COUNT FETAL MOVEMENTS °1. Find a quiet and comfortable area to sit or lie down on your left side. Lying on your left side provides the best blood and oxygen circulation to your baby. °2. Write down the day and time on a sheet of paper or in a journal. °3. Start counting kicks, flutters, swishes, rolls, or jabs in a 2-hour period. You should feel at least 10 movements within 2 hours. °4. If you do not feel 10 movements in 2 hours, wait 2-3 hours and count again. Look for a change in the pattern or not enough counts in 2 hours. °SEEK MEDICAL CARE IF: °· You feel less than 10 counts in 2 hours, tried twice. °· There is no movement in over an hour. °· The pattern is changing or taking longer each day to reach 10 counts in 2 hours. °· You feel the baby is not moving as he or she usually does. °Date: ____________ Movements: ____________ Start time: ____________ Finish time: ____________  °Date: ____________ Movements: ____________ Start time: ____________ Finish time: ____________ °Date: ____________ Movements: ____________ Start time: ____________ Finish time: ____________ °Date: ____________ Movements:  ____________ Start time: ____________ Finish time: ____________ °Date: ____________ Movements: ____________ Start time: ____________ Finish time: ____________ °Date: ____________ Movements: ____________ Start time: ____________ Finish time: ____________ °Date: ____________ Movements: ____________ Start time: ____________ Finish time: ____________ °Date: ____________ Movements: ____________ Start time: ____________ Finish time: ____________  °Date: ____________ Movements: ____________ Start time: ____________ Finish time: ____________ °Date: ____________ Movements: ____________ Start time: ____________ Finish time: ____________ °Date: ____________ Movements: ____________ Start time: ____________ Finish time: ____________ °Date: ____________ Movements: ____________ Start time: ____________ Finish time: ____________ °Date: ____________ Movements: ____________ Start time: ____________ Finish time: ____________ °Date: ____________ Movements: ____________ Start time: ____________ Finish time: ____________ °Date: ____________ Movements: ____________ Start time: ____________ Finish time: ____________  °Date: ____________ Movements: ____________ Start time: ____________ Finish time: ____________ °Date: ____________ Movements: ____________ Start time: ____________ Finish time: ____________ °Date: ____________ Movements: ____________ Start time: ____________ Finish time: ____________ °Date: ____________ Movements: ____________ Start time: ____________ Finish time: ____________ °Date: ____________ Movements: ____________ Start time: ____________ Finish time: ____________ °Date: ____________ Movements: ____________ Start time: ____________ Finish time: ____________ °Date: ____________ Movements: ____________ Start time: ____________ Finish time: ____________  °Date: ____________ Movements: ____________ Start time: ____________ Finish time: ____________ °Date: ____________ Movements: ____________ Start time: ____________ Finish  time: ____________ °Date: ____________ Movements: ____________ Start time: ____________ Finish time: ____________ °Date: ____________ Movements: ____________ Start time: ____________ Finish time: ____________ °Date: ____________ Movements: ____________ Start time: ____________ Finish time: ____________ °Date: ____________ Movements: ____________ Start time: ____________ Finish time: ____________ °Date: ____________ Movements: ____________ Start time: ____________ Finish time: ____________  °Date: ____________ Movements: ____________ Start time: ____________ Finish   time: ____________ °Date: ____________ Movements: ____________ Start time: ____________ Finish time: ____________ °Date: ____________ Movements: ____________ Start time: ____________ Finish time: ____________ °Date: ____________ Movements: ____________ Start time: ____________ Finish time: ____________ °Date: ____________ Movements: ____________ Start time: ____________ Finish time: ____________ °Date: ____________ Movements: ____________ Start time: ____________ Finish time: ____________ °Date: ____________ Movements: ____________ Start time: ____________ Finish time: ____________  °Date: ____________ Movements: ____________ Start time: ____________ Finish time: ____________ °Date: ____________ Movements: ____________ Start time: ____________ Finish time: ____________ °Date: ____________ Movements: ____________ Start time: ____________ Finish time: ____________ °Date: ____________ Movements: ____________ Start time: ____________ Finish time: ____________ °Date: ____________ Movements: ____________ Start time: ____________ Finish time: ____________ °Date: ____________ Movements: ____________ Start time: ____________ Finish time: ____________ °Date: ____________ Movements: ____________ Start time: ____________ Finish time: ____________  °Date: ____________ Movements: ____________ Start time: ____________ Finish time: ____________ °Date: ____________  Movements: ____________ Start time: ____________ Finish time: ____________ °Date: ____________ Movements: ____________ Start time: ____________ Finish time: ____________ °Date: ____________ Movements: ____________ Start time: ____________ Finish time: ____________ °Date: ____________ Movements: ____________ Start time: ____________ Finish time: ____________ °Date: ____________ Movements: ____________ Start time: ____________ Finish time: ____________ °Date: ____________ Movements: ____________ Start time: ____________ Finish time: ____________  °Date: ____________ Movements: ____________ Start time: ____________ Finish time: ____________ °Date: ____________ Movements: ____________ Start time: ____________ Finish time: ____________ °Date: ____________ Movements: ____________ Start time: ____________ Finish time: ____________ °Date: ____________ Movements: ____________ Start time: ____________ Finish time: ____________ °Date: ____________ Movements: ____________ Start time: ____________ Finish time: ____________ °Date: ____________ Movements: ____________ Start time: ____________ Finish time: ____________ °Document Released: 05/19/2006 Document Revised: 09/03/2013 Document Reviewed: 02/14/2012 °[employer]® Patient Information ©2015 [employer]. This information is not intended to replace advice given to you by your health care provider. Make sure you discuss any questions you have with your health care provider. °Third Trimester of Pregnancy °The third trimester is from week 29 through week 42, months 7 through 9. The third trimester is a time when the fetus is growing rapidly. At the end of the ninth month, the fetus is about 20 inches in length and weighs 6-10 pounds.  °BODY CHANGES °Your body goes through many changes during pregnancy. The changes vary from woman to woman.  °· Your weight will continue to increase. You can expect to gain 25-35 pounds (11-16 kg) by the end of the pregnancy. °· You may begin to get  stretch marks on your hips, abdomen, and breasts. °· You may urinate more often because the fetus is moving lower into your pelvis and pressing on your bladder. °· You may develop or continue to have heartburn as a result of your pregnancy. °· You may develop constipation because certain hormones are causing the muscles that push waste through your intestines to slow down. °· You may develop hemorrhoids or swollen, bulging veins (varicose veins). °· You may have pelvic pain because of the weight gain and pregnancy hormones relaxing your joints between the bones in your pelvis. Backaches may result from overexertion of the muscles supporting your posture. °· You may have changes in your hair. These can include thickening of your hair, rapid growth, and changes in texture. Some women also have hair loss during or after pregnancy, or hair that feels dry or thin. Your hair will most likely return to normal after your baby is born. °· Your breasts will continue to grow and be tender. A yellow discharge may leak from your breasts called colostrum. °· Your belly button may stick out. °· You may feel short of   breath because of your expanding uterus. °· You may notice the fetus "dropping," or moving lower in your abdomen. °· You may have a bloody mucus discharge. This usually occurs a few days to a week before labor begins. °· Your cervix becomes thin and soft (effaced) near your due date. °WHAT TO EXPECT AT YOUR PRENATAL EXAMS  °You will have prenatal exams every 2 weeks until week 36. Then, you will have weekly prenatal exams. During a routine prenatal visit: °· You will be weighed to make sure you and the fetus are growing normally. °· Your blood pressure is taken. °· Your abdomen will be measured to track your baby's growth. °· The fetal heartbeat will be listened to. °· Any test results from the previous visit will be discussed. °· You may have a cervical check near your due date to see if you have effaced. °At around  36 weeks, your caregiver will check your cervix. At the same time, your caregiver will also perform a test on the secretions of the vaginal tissue. This test is to determine if a type of bacteria, Group B streptococcus, is present. Your caregiver will explain this further. °Your caregiver may ask you: °· What your birth plan is. °· How you are feeling. °· If you are feeling the baby move. °· If you have had any abnormal symptoms, such as leaking fluid, bleeding, severe headaches, or abdominal cramping. °· If you have any questions. °Other tests or screenings that may be performed during your third trimester include: °· Blood tests that check for low iron levels (anemia). °· Fetal testing to check the health, activity level, and growth of the fetus. Testing is done if you have certain medical conditions or if there are problems during the pregnancy. °FALSE LABOR °You may feel small, irregular contractions that eventually go away. These are called Braxton Hicks contractions, or false labor. Contractions may last for hours, days, or even weeks before true labor sets in. If contractions come at regular intervals, intensify, or become painful, it is best to be seen by your caregiver.  °SIGNS OF LABOR  °· Menstrual-like cramps. °· Contractions that are 5 minutes apart or less. °· Contractions that start on the top of the uterus and spread down to the lower abdomen and back. °· A sense of increased pelvic pressure or back pain. °· A watery or bloody mucus discharge that comes from the vagina. °If you have any of these signs before the 37th week of pregnancy, call your caregiver right away. You need to go to the hospital to get checked immediately. °HOME CARE INSTRUCTIONS  °· Avoid all smoking, herbs, alcohol, and unprescribed drugs. These chemicals affect the formation and growth of the baby. °· Follow your caregiver's instructions regarding medicine use. There are medicines that are either safe or unsafe to take during  pregnancy. °· Exercise only as directed by your caregiver. Experiencing uterine cramps is a good sign to stop exercising. °· Continue to eat regular, healthy meals. °· Wear a good support bra for breast tenderness. °· Do not use hot tubs, steam rooms, or saunas. °· Wear your seat belt at all times when driving. °· Avoid raw meat, uncooked cheese, cat litter boxes, and soil used by cats. These carry germs that can cause birth defects in the baby. °· Take your prenatal vitamins. °· Try taking a stool softener (if your caregiver approves) if you develop constipation. Eat more high-fiber foods, such as fresh vegetables or fruit and whole grains. Drink   plenty of fluids to keep your urine clear or pale yellow. °· Take warm sitz baths to soothe any pain or discomfort caused by hemorrhoids. Use hemorrhoid cream if your caregiver approves. °· If you develop varicose veins, wear support hose. Elevate your feet for 15 minutes, 3-4 times a day. Limit salt in your diet. °· Avoid heavy lifting, wear low heal shoes, and practice good posture. °· Rest a lot with your legs elevated if you have leg cramps or low back pain. °· Visit your dentist if you have not gone during your pregnancy. Use a soft toothbrush to brush your teeth and be gentle when you floss. °· A sexual relationship may be continued unless your caregiver directs you otherwise. °· Do not travel far distances unless it is absolutely necessary and only with the approval of your caregiver. °· Take prenatal classes to understand, practice, and ask questions about the labor and delivery. °· Make a trial run to the hospital. °· Pack your hospital bag. °· Prepare the baby's nursery. °· Continue to go to all your prenatal visits as directed by your caregiver. °SEEK MEDICAL CARE IF: °· You are unsure if you are in labor or if your water has broken. °· You have dizziness. °· You have mild pelvic cramps, pelvic pressure, or nagging pain in your abdominal area. °· You have  persistent nausea, vomiting, or diarrhea. °· You have a bad smelling vaginal discharge. °· You have pain with urination. °SEEK IMMEDIATE MEDICAL CARE IF:  °· You have a fever. °· You are leaking fluid from your vagina. °· You have spotting or bleeding from your vagina. °· You have severe abdominal cramping or pain. °· You have rapid weight loss or gain. °· You have shortness of breath with chest pain. °· You notice sudden or extreme swelling of your face, hands, ankles, feet, or legs. °· You have not felt your baby move in over an hour. °· You have severe headaches that do not go away with medicine. °· You have vision changes. °Document Released: 04/13/2001 Document Revised: 04/24/2013 Document Reviewed: 06/20/2012 °[employer]® Patient Information ©2015 [employer]. This information is not intended to replace advice given to you by your health care provider. Make sure you discuss any questions you have with your health care provider. ° °

## 2013-12-25 NOTE — MAU Note (Signed)
Patient states she was seen in the office and sent to MAU for evaluation for decreased fetal movement. Patient reports some irregular contractions. No bleeding or leaking fluid.

## 2013-12-27 ENCOUNTER — Inpatient Hospital Stay (HOSPITAL_COMMUNITY): Payer: Medicaid Other | Admitting: Anesthesiology

## 2013-12-27 ENCOUNTER — Encounter (HOSPITAL_COMMUNITY): Payer: Medicaid Other | Admitting: Anesthesiology

## 2013-12-27 ENCOUNTER — Encounter (HOSPITAL_COMMUNITY): Payer: Self-pay | Admitting: *Deleted

## 2013-12-27 ENCOUNTER — Inpatient Hospital Stay (HOSPITAL_COMMUNITY)
Admission: AD | Admit: 2013-12-27 | Discharge: 2013-12-29 | DRG: 774 | Disposition: A | Discharge status: Home / Self Care | Payer: Medicaid Other | Source: Ambulatory Visit | Attending: Obstetrics & Gynecology | Admitting: Obstetrics & Gynecology

## 2013-12-27 DIAGNOSIS — Z6841 Body Mass Index (BMI) 40.0 and over, adult: Secondary | ICD-10-CM | POA: Diagnosis not present

## 2013-12-27 DIAGNOSIS — O479 False labor, unspecified: Secondary | ICD-10-CM | POA: Diagnosis present

## 2013-12-27 DIAGNOSIS — O1002 Pre-existing essential hypertension complicating childbirth: Principal | ICD-10-CM | POA: Diagnosis present

## 2013-12-27 DIAGNOSIS — O34219 Maternal care for unspecified type scar from previous cesarean delivery: Secondary | ICD-10-CM | POA: Diagnosis present

## 2013-12-27 DIAGNOSIS — Z87891 Personal history of nicotine dependence: Secondary | ICD-10-CM | POA: Diagnosis not present

## 2013-12-27 DIAGNOSIS — IMO0002 Reserved for concepts with insufficient information to code with codable children: Secondary | ICD-10-CM | POA: Diagnosis present

## 2013-12-27 DIAGNOSIS — IMO0001 Reserved for inherently not codable concepts without codable children: Secondary | ICD-10-CM

## 2013-12-27 DIAGNOSIS — Z833 Family history of diabetes mellitus: Secondary | ICD-10-CM

## 2013-12-27 DIAGNOSIS — E669 Obesity, unspecified: Secondary | ICD-10-CM | POA: Diagnosis present

## 2013-12-27 DIAGNOSIS — O99214 Obesity complicating childbirth: Secondary | ICD-10-CM

## 2013-12-27 LAB — COMPREHENSIVE METABOLIC PANEL
ALT: 6 U/L (ref 0–35)
AST: 10 U/L (ref 0–37)
Albumin: 2.9 g/dL — ABNORMAL LOW (ref 3.5–5.2)
Alkaline Phosphatase: 170 U/L — ABNORMAL HIGH (ref 39–117)
Anion gap: 16 — ABNORMAL HIGH (ref 5–15)
BUN: 6 mg/dL (ref 6–23)
CALCIUM: 9.1 mg/dL (ref 8.4–10.5)
CO2: 19 mEq/L (ref 19–32)
CREATININE: 0.5 mg/dL (ref 0.50–1.10)
Chloride: 100 mEq/L (ref 96–112)
GLUCOSE: 109 mg/dL — AB (ref 70–99)
Potassium: 4.1 mEq/L (ref 3.7–5.3)
Sodium: 135 mEq/L — ABNORMAL LOW (ref 137–147)
Total Bilirubin: <0.2 mg/dL — ABNORMAL LOW (ref 0.3–1.2)
Total Protein: 6.8 g/dL (ref 6.0–8.3)

## 2013-12-27 LAB — CBC
HEMATOCRIT: 33 % — AB (ref 36.0–46.0)
HEMOGLOBIN: 11 g/dL — AB (ref 12.0–15.0)
MCH: 28.1 pg (ref 26.0–34.0)
MCHC: 33.3 g/dL (ref 30.0–36.0)
MCV: 84.2 fL (ref 78.0–100.0)
Platelets: 245 10*3/uL (ref 150–400)
RBC: 3.92 MIL/uL (ref 3.87–5.11)
RDW: 14.6 % (ref 11.5–15.5)
WBC: 15.5 10*3/uL — ABNORMAL HIGH (ref 4.0–10.5)

## 2013-12-27 LAB — PROTEIN / CREATININE RATIO, URINE
Creatinine, Urine: 217.57 mg/dL
Protein Creatinine Ratio: 0.14 (ref 0.00–0.15)
Total Protein, Urine: 31.4 mg/dL

## 2013-12-27 LAB — URIC ACID: URIC ACID, SERUM: 4.6 mg/dL (ref 2.4–7.0)

## 2013-12-27 LAB — LACTATE DEHYDROGENASE: LDH: 161 U/L (ref 94–250)

## 2013-12-27 LAB — RPR

## 2013-12-27 MED ORDER — ONDANSETRON HCL 4 MG/2ML IJ SOLN: 4.0000 mg | Freq: Four times a day (QID) | INTRAMUSCULAR | Status: DC | PRN

## 2013-12-27 MED ORDER — ACETAMINOPHEN 325 MG PO TABS
650.0000 mg | ORAL_TABLET | ORAL | Status: DC | PRN
  Administered 2013-12-27: 650 mg via ORAL
  Filled 2013-12-27: qty 2

## 2013-12-27 MED ORDER — LACTATED RINGERS IV SOLN: INTRAVENOUS | Status: DC

## 2013-12-27 MED ORDER — LIDOCAINE HCL (PF) 1 % IJ SOLN
30.0000 mL | INTRAMUSCULAR | Status: DC | PRN
  Filled 2013-12-27: qty 30

## 2013-12-27 MED ORDER — LACTATED RINGERS IV SOLN
INTRAVENOUS | Status: DC
  Administered 2013-12-27 (×2): via INTRAVENOUS

## 2013-12-27 MED ORDER — CITRIC ACID-SODIUM CITRATE 334-500 MG/5ML PO SOLN
30.0000 mL | Freq: Once | ORAL | Status: AC
  Administered 2013-12-27: 30 mL via ORAL

## 2013-12-27 MED ORDER — OXYTOCIN 40 UNITS IN LACTATED RINGERS INFUSION - SIMPLE MED: 62.5000 mL/h | INTRAVENOUS | Status: DC | PRN

## 2013-12-27 MED ORDER — LANOLIN HYDROUS EX OINT: TOPICAL_OINTMENT | CUTANEOUS | Status: DC | PRN

## 2013-12-27 MED ORDER — IBUPROFEN 600 MG PO TABS
600.0000 mg | ORAL_TABLET | Freq: Four times a day (QID) | ORAL | Status: DC | PRN
  Administered 2013-12-27: 600 mg via ORAL
  Filled 2013-12-27 (×2): qty 1

## 2013-12-27 MED ORDER — BENZOCAINE-MENTHOL 20-0.5 % EX AERO
1.0000 | INHALATION_SPRAY | CUTANEOUS | Status: DC | PRN
Start: 2013-12-27 — End: 2013-12-29
  Administered 2013-12-27: 1 via TOPICAL
  Filled 2013-12-27: qty 56

## 2013-12-27 MED ORDER — ZOLPIDEM TARTRATE 5 MG PO TABS
5.0000 mg | ORAL_TABLET | Freq: Every evening | ORAL | Status: DC | PRN
Start: 2013-12-27 — End: 2013-12-29

## 2013-12-27 MED ORDER — SENNOSIDES-DOCUSATE SODIUM 8.6-50 MG PO TABS
2.0000 | ORAL_TABLET | ORAL | Status: DC
  Administered 2013-12-27 – 2013-12-28 (×2): 2 via ORAL
  Filled 2013-12-27 (×2): qty 2

## 2013-12-27 MED ORDER — OXYTOCIN BOLUS FROM INFUSION: 500.0000 mL | INTRAVENOUS | Status: DC

## 2013-12-27 MED ORDER — WITCH HAZEL-GLYCERIN EX PADS: 1.0000 "application " | MEDICATED_PAD | CUTANEOUS | Status: DC | PRN

## 2013-12-27 MED ORDER — FLEET ENEMA 7-19 GM/118ML RE ENEM: 1.0000 | ENEMA | RECTAL | Status: DC | PRN

## 2013-12-27 MED ORDER — LIDOCAINE HCL (PF) 1 % IJ SOLN
INTRAMUSCULAR | Status: DC | PRN
  Administered 2013-12-27: 2 mL
  Administered 2013-12-27 (×3): 4 mL

## 2013-12-27 MED ORDER — PRENATAL MULTIVITAMIN CH
1.0000 | ORAL_TABLET | Freq: Every day | ORAL | Status: DC
Start: 2013-12-28 — End: 2013-12-29
  Administered 2013-12-28: 1 via ORAL
  Filled 2013-12-27: qty 1

## 2013-12-27 MED ORDER — SIMETHICONE 80 MG PO CHEW: 80.0000 mg | CHEWABLE_TABLET | ORAL | Status: DC | PRN

## 2013-12-27 MED ORDER — CITRIC ACID-SODIUM CITRATE 334-500 MG/5ML PO SOLN
30.0000 mL | ORAL | Status: DC | PRN
  Filled 2013-12-27: qty 15

## 2013-12-27 MED ORDER — TETANUS-DIPHTH-ACELL PERTUSSIS 5-2.5-18.5 LF-MCG/0.5 IM SUSP
0.5000 mL | Freq: Once | INTRAMUSCULAR | Status: AC
  Administered 2013-12-28: 0.5 mL via INTRAMUSCULAR
  Filled 2013-12-27: qty 0.5

## 2013-12-27 MED ORDER — OXYTOCIN 40 UNITS IN LACTATED RINGERS INFUSION - SIMPLE MED
62.5000 mL/h | INTRAVENOUS | Status: DC
  Filled 2013-12-27: qty 1000

## 2013-12-27 MED ORDER — DIPHENHYDRAMINE HCL 25 MG PO CAPS
25.0000 mg | ORAL_CAPSULE | Freq: Four times a day (QID) | ORAL | Status: DC | PRN
  Administered 2013-12-28 – 2013-12-29 (×5): 25 mg via ORAL
  Filled 2013-12-27 (×5): qty 1

## 2013-12-27 MED ORDER — IBUPROFEN 600 MG PO TABS
600.0000 mg | ORAL_TABLET | Freq: Four times a day (QID) | ORAL | Status: DC
  Administered 2013-12-27 – 2013-12-29 (×7): 600 mg via ORAL
  Filled 2013-12-27 (×6): qty 1

## 2013-12-27 MED ORDER — ONDANSETRON HCL 4 MG/2ML IJ SOLN: 4.0000 mg | INTRAMUSCULAR | Status: DC | PRN

## 2013-12-27 MED ORDER — OXYCODONE-ACETAMINOPHEN 5-325 MG PO TABS
1.0000 | ORAL_TABLET | ORAL | Status: DC | PRN
  Administered 2013-12-28: 2 via ORAL
  Administered 2013-12-28 (×5): 1 via ORAL
  Administered 2013-12-29 (×2): 2 via ORAL
  Filled 2013-12-27 (×2): qty 1
  Filled 2013-12-27: qty 2
  Filled 2013-12-27 (×2): qty 1
  Filled 2013-12-27 (×2): qty 2
  Filled 2013-12-27: qty 1

## 2013-12-27 MED ORDER — DIPHENHYDRAMINE HCL 50 MG/ML IJ SOLN: 12.5000 mg | INTRAMUSCULAR | Status: DC | PRN

## 2013-12-27 MED ORDER — LACTATED RINGERS IV SOLN
INTRAVENOUS | Status: DC
  Administered 2013-12-27: 300 mL/h via INTRAUTERINE

## 2013-12-27 MED ORDER — FENTANYL 2.5 MCG/ML BUPIVACAINE 1/10 % EPIDURAL INFUSION (WH - ANES)
14.0000 mL/h | INTRAMUSCULAR | Status: DC | PRN
  Administered 2013-12-27: 14 mL/h via EPIDURAL
  Filled 2013-12-27: qty 125

## 2013-12-27 MED ORDER — OXYCODONE-ACETAMINOPHEN 5-325 MG PO TABS: 1.0000 | ORAL_TABLET | ORAL | Status: DC | PRN

## 2013-12-27 MED ORDER — OXYTOCIN 40 UNITS IN LACTATED RINGERS INFUSION - SIMPLE MED
2.0000 m[IU]/min | INTRAVENOUS | Status: DC
  Administered 2013-12-27: 2 m[IU]/min via INTRAVENOUS

## 2013-12-27 MED ORDER — DIBUCAINE 1 % RE OINT: 1.0000 "application " | TOPICAL_OINTMENT | RECTAL | Status: DC | PRN

## 2013-12-27 MED ORDER — EPHEDRINE 5 MG/ML INJ
10.0000 mg | INTRAVENOUS | Status: DC | PRN
  Filled 2013-12-27: qty 2

## 2013-12-27 MED ORDER — LACTATED RINGERS IV SOLN: 500.0000 mL | INTRAVENOUS | Status: DC | PRN

## 2013-12-27 MED ORDER — PHENYLEPHRINE 40 MCG/ML (10ML) SYRINGE FOR IV PUSH (FOR BLOOD PRESSURE SUPPORT)
80.0000 ug | PREFILLED_SYRINGE | INTRAVENOUS | Status: DC | PRN
Start: 2013-12-27 — End: 2013-12-29
  Filled 2013-12-27: qty 2

## 2013-12-27 MED ORDER — LACTATED RINGERS IV SOLN
500.0000 mL | Freq: Once | INTRAVENOUS | Status: AC
  Administered 2013-12-27: 500 mL via INTRAVENOUS

## 2013-12-27 MED ORDER — PANTOPRAZOLE SODIUM 40 MG IV SOLR
40.0000 mg | Freq: Once | INTRAVENOUS | Status: AC
  Administered 2013-12-27: 40 mg via INTRAVENOUS
  Filled 2013-12-27 (×2): qty 40

## 2013-12-27 MED ORDER — PHENYLEPHRINE 40 MCG/ML (10ML) SYRINGE FOR IV PUSH (FOR BLOOD PRESSURE SUPPORT)
80.0000 ug | PREFILLED_SYRINGE | INTRAVENOUS | Status: DC | PRN
  Filled 2013-12-27: qty 10
  Filled 2013-12-27: qty 2

## 2013-12-27 MED ORDER — ONDANSETRON HCL 4 MG PO TABS
4.0000 mg | ORAL_TABLET | ORAL | Status: DC | PRN
Start: 2013-12-27 — End: 2013-12-29

## 2013-12-27 NOTE — Progress Notes (Signed)
[name] is a 33 y.o. [id number] at [redacted]w[redacted]d by LMP admitted for active labor, rupture of membranes  Subjective: Patient feeling contraction pain on the left side and her back  Objective: BP 99/69  Pulse 92  Temp(Src) 98.3 F (36.8 C) (Oral)  Resp 18  Ht  (1.499 m)  Wt 101.152 kg (223 lb)  BMI 45.02 kg/m2  SpO2 100%  LMP 03/23/2013      FHT:  FHR: 130 bpm, variability: moderate,  accelerations:  Present,  decelerations:  Present 3 late variable decelerations nadir 60s longest lasting 45 seconds with return to baseline UC:   regular, every 2 minutes SVE:   Dilation: 8.5 Effacement (%): 100 Station: -1 Exam by:: Dr. Mora Appl  Labs: Lab Results  Component Value Date   WBC 15.5* 12/27/2013   HGB 11.0* 12/27/2013   HCT 33.0* 12/27/2013   MCV 84.2 12/27/2013   PLT 245 12/27/2013    Assessment / Plan: Spontaneous labor, progressing normally FSE placed by Nursing staff. Tracing with moderate variability and reactive.  Late decelerations stopped and tracing improved with maternal change in position  And oxygen  Labor: Progressing normally Preeclampsia:  no signs or symptoms of toxicity Fetal Wellbeing:  Category II Pain Control:  Epidural I/D:  n/a Anticipated MOD:  NSVD  [name] 12/27/2013, 11:02 AM

## 2013-12-27 NOTE — Anesthesia Procedure Notes (Signed)
Epidural Patient location during procedure: OB Start time: 12/27/2013 10:04 AM  Staffing Anesthesiologist: Jamesyn Lindell Performed by: anesthesiologist   Preanesthetic Checklist Completed: patient identified, site marked, surgical consent, pre-op evaluation, timeout performed, IV checked, risks and benefits discussed and monitors and equipment checked  Epidural Patient position: sitting Prep: site prepped and draped and DuraPrep Patient monitoring: continuous pulse ox and blood pressure Approach: midline Location: L3-L4 Injection technique: LOR air  Needle:  Needle type: Tuohy  Needle gauge: 17 G Needle length: 9 cm and 9 Needle insertion depth: 8 cm Catheter type: closed end flexible Catheter size: 19 Gauge Catheter at skin depth: 13 cm Test dose: negative  Assessment Events: blood not aspirated, injection not painful, no injection resistance, negative IV test and no paresthesia  Additional Notes Discussed risk of headache, infection, bleeding, nerve injury and failed or incomplete block.  Patient voices understanding and wishes to proceed.  Epidural placed easily on first attempt.  No paresthesia.  Patient tolerated procedure well with no apparent complications.  Jasmine December, MDReason for block:procedure for pain

## 2013-12-27 NOTE — H&P (Signed)
[name] is a 33 y.o. female presenting for regular painful contractions, ruptured membranes in  MAU grossly, clear. She reports normal FM, no vaginal bleeding.  She has h/o primary cesarean delivery followed by successful VBAC.  Her pregnancy complicated by Chronic HTN, pre-e labs early in pregnancy with mild  Elevation of liver function tests then normalized.  She is w/o HA, visual changes, no RUQ pain  Maternal Medical History:  Reason for admission: Rupture of membranes and contractions.   Contractions: Onset was 6-12 hours ago.   Frequency: regular.   Duration is approximately 60 seconds.   Perceived severity is strong.    Fetal activity: Perceived fetal activity is normal.   Last perceived fetal movement was within the past 12 hours.    Prenatal complications: PIH.   Prenatal Complications - Diabetes: none.    OB History   Grav Para Term Preterm Abortions TAB SAB Ect Mult Living   Past Medical History  Diagnosis Date  . GERD (gastroesophageal reflux disease)   . Indigestion   . Ectopic pregnancy   . Headache(784.0)   . Infection     UTI  . Fibromyalgia     'had it before' no problems in 5-24yrs  . Pregnancy induced hypertension     with first   Past Surgical History  Procedure Laterality Date  . Cesarean section    . Therapeutic abortion    . Cardiac catheterization      not a cardiac problem- really bad acid reflex  . Cholecystectomy N/A 10/11/2013    Procedure: LAPAROSCOPIC CHOLECYSTECTOMY;  Surgeon: Shelly Rubenstein, MD;  Location: [facility];  Service: General;  Laterality: N/A;  . Cardiac catheterization  2006   Family History: family history includes Cancer in her maternal aunt; Diabetes in her maternal aunt and maternal grandmother; Heart disease in her maternal grandmother; Hypertension in her maternal grandmother. There is no history of Other. Social History:  reports that she has quit smoking. She has never used smokeless  tobacco. She reports that she does not drink alcohol or use illicit drugs.   Prenatal Transfer Tool  Maternal Diabetes: No Genetic Screening: Normal Maternal Ultrasounds/Referrals: Normal Fetal Ultrasounds or other Referrals:  Referred to Materal Fetal Medicine  Maternal Substance Abuse:  No Significant Maternal Medications:  Meds include: Zantac Other: Zyrtec Significant Maternal Lab Results:  Lab values include: Group B Strep negative Other Comments:  None  Review of Systems  Constitutional: Negative for fever and chills.  Eyes: Negative for blurred vision.  Respiratory: Negative for cough.   Cardiovascular: Negative for chest pain.  Gastrointestinal: Negative for heartburn.  Genitourinary: Negative for dysuria.  Skin: Negative for rash.  Neurological: Negative for headaches.  Endo/Heme/Allergies: Does not bruise/bleed easily.  Psychiatric/Behavioral: Negative for depression.  All other systems reviewed and are negative.   Dilation: 2 Effacement (%): 80 Station: -1 Exam by:: K.WIlson,RN Blood pressure 117/74, pulse 103, temperature 98.1 F (36.7 C), resp. rate 18, last menstrual period 03/23/2013, unknown if currently breastfeeding. Maternal Exam:  Uterine Assessment: Contraction strength is moderate.  Contraction duration is 60 seconds. Contraction frequency is regular.   Abdomen: Patient reports no abdominal tenderness. Surgical scars: low transverse.   Fundal height is 38 cm.   Estimated fetal weight is 2800 grams.   Fetal presentation: vertex  Introitus: Normal vulva. Normal vagina.  Ferning test: positive.  Amniotic fluid character: clear.  Pelvis: adequate for delivery.  Cervix: Cervix evaluated by digital exam.   In MAU 2cm / 80/-2  Fetal Exam Fetal Monitor Review: Mode: hand-held doppler probe.   Baseline rate: 140.  Variability: moderate (6-25 bpm).   Pattern: accelerations present and no decelerations.    Fetal State Assessment: Category I - tracings  are normal.     Physical Exam  Vitals reviewed. Constitutional: She is oriented to person, place, and time. She appears well-developed and well-nourished.  HENT:  Head: Normocephalic.  Neck: Normal range of motion.  Cardiovascular: Normal rate.   GI: Soft.  Genitourinary: Vagina normal.  Musculoskeletal: Normal range of motion.  Neurological: She is alert and oriented to person, place, and time. She has normal reflexes.  Skin: Skin is warm.    Prenatal labs: ABO, Rh:  O + Antibody:  neg Rubella:  Immune RPR:   NR HBsAg:   Neg HIV:   Neg GBS:   Neg  Assessment/Plan: 33 yo [id number] with Chronic HTN, no meds with SROM in early active labor Continuous monitoring Epidural on demand PIH labs on admission GBS negative TOLAC will closely monitor   Gertrude Bucks STACIA 12/27/2013, 9:05 AM

## 2013-12-27 NOTE — Consult Note (Signed)
Neonatology Note:  Attendance at Code Apgar:  Our team responded to a Code Apgar call to room # 165 following NSVD, due to infant with apnea. The requesting physician was Dr. Pinn. The mother is a G7P2A4 O pos, GBS neg with chronic HTN. ROM occurred 7 hours PTD and the fluid was pink-tinged, no meconium. At delivery, the baby was slow to breathe and ws hypotonic. The OB nursing staff in attendance gave vigorous stimulation and a Code Apgar was called. Our team arrived at 1 minutes of life, at which time the baby was starting to cry, but remained dusky and somewhat hypotonic. We placed a pulse oximeter and observed the baby as he recovered gradually. O2 saturations were always normal for age in room air and were > 90% by 4-5 minutes. Ap 6/9. I spoke with the parents in the DR, then transferred the baby to the Pediatrician's care.  Mariany Mackintosh C. Faraaz Wolin, MD  

## 2013-12-27 NOTE — Anesthesia Preprocedure Evaluation (Signed)
Anesthesia Evaluation  Patient identified by MRN, date of birth, ID band Patient awake    Reviewed: Allergy & Precautions, H&P , NPO status , Patient's Chart, lab work & pertinent test results, reviewed documented beta blocker date and time   History of Anesthesia Complications Negative for: history of anesthetic complications  Airway Mallampati: III TM Distance: >3 FB Neck ROM: full    Dental  (+) Teeth Intact   Pulmonary neg pulmonary ROS, former smoker,  breath sounds clear to auscultation        Cardiovascular negative cardio ROS  Rhythm:regular Rate:Normal  Cardiac cath years ago for chest pain: no CAD.  Diagnosed with anxiety, GERD, and fibromyalgia   Neuro/Psych Anxiety negative neurological ROS  negative psych ROS   GI/Hepatic Neg liver ROS, GERD-  Medicated,  Endo/Other  Morbid obesity  Renal/GU negative Renal ROS     Musculoskeletal  (+) Fibromyalgia -  Abdominal   Peds  Hematology negative hematology ROS (+)   Anesthesia Other Findings   Reproductive/Obstetrics (+) Pregnancy                           Anesthesia Physical Anesthesia Plan  ASA: III  Anesthesia Plan: Epidural   Post-op Pain Management:    Induction:   Airway Management Planned:   Additional Equipment:   Intra-op Plan:   Post-operative Plan:   Informed Consent: I have reviewed the patients History and Physical, chart, labs and discussed the procedure including the risks, benefits and alternatives for the proposed anesthesia with the patient or authorized representative who has indicated his/her understanding and acceptance.     Plan Discussed with:   Anesthesia Plan Comments:         Anesthesia Quick Evaluation

## 2013-12-27 NOTE — Progress Notes (Signed)
Patient ID: [name], female   DOB: 07/09/1980, 33 y.o.   MRN: [id number]  Fetal heart tracing with repetitive variables nadir 55-60 now lasting one minute.  Patient counseled that we will try an amnioinfusion to try to alleviate the variable decels, however she Is aware that there is a possibility that she will have to undergo a repeat cesarean delivery.  She voiced understanding and a consent form for repeat LTCS was performed and placed in chart.   Mickael Mcnutt STACIA

## 2013-12-27 NOTE — MAU Note (Signed)
Pt reports cts since 5am. Repots some leaking with ctx. 1cm last cervical check good fetal movement reprted

## 2013-12-28 LAB — CBC
HEMATOCRIT: 25.7 % — AB (ref 36.0–46.0)
Hemoglobin: 8.3 g/dL — ABNORMAL LOW (ref 12.0–15.0)
MCH: 27.4 pg (ref 26.0–34.0)
MCHC: 32.3 g/dL (ref 30.0–36.0)
MCV: 84.8 fL (ref 78.0–100.0)
Platelets: 199 10*3/uL (ref 150–400)
RBC: 3.03 MIL/uL — ABNORMAL LOW (ref 3.87–5.11)
RDW: 14.8 % (ref 11.5–15.5)
WBC: 15.9 10*3/uL — ABNORMAL HIGH (ref 4.0–10.5)

## 2013-12-28 NOTE — Lactation Note (Signed)
This note was copied from the chart of [name]. Lactation Consultation Note  According to [facility] RN mother wants to pump and bottle feed and supplement with formula.   Patient Name: [name] [id number] Date: 12/28/2013 Reason for consult: Follow-up assessment   Maternal Data Has patient been taught Hand Expression?: Yes Does the patient have breastfeeding experience prior to this delivery?: Yes  Feeding Feeding Type: Breast Fed Length of feed: 15 min  LATCH Score/Interventions Latch: Grasps breast easily, tongue down, lips flanged, rhythmical sucking. Intervention(s): Waking techniques Intervention(s): Adjust position;Assist with latch;Breast massage;Breast compression  Audible Swallowing: Spontaneous and intermittent  Type of Nipple: Everted at rest and after stimulation  Comfort (Breast/Nipple): Filling, red/small blisters or bruises, mild/mod discomfort  Problem noted: Cracked, bleeding, blisters, bruises Interventions  (Cracked/bleeding/bruising/blister): Expressed breast milk to nipple  Hold (Positioning): Assistance needed to correctly position infant at breast and maintain latch. Intervention(s): Breastfeeding basics reviewed;Support Pillows;Position options;Skin to skin  LATCH Score: 8  Lactation Tools Discussed/Used     Consult Status Consult Status: Follow-up Date: 12/29/13 Follow-up type: In-patient    Dahlia Byes [facility] 12/28/2013, 1:49 PM

## 2013-12-28 NOTE — Anesthesia Postprocedure Evaluation (Signed)
  Anesthesia Post-op Note  Patient: [name]  Procedure(s) Performed: * No procedures listed *  Patient Location: Mother/Baby  Anesthesia Type:Epidural  Level of Consciousness: awake, alert , oriented and patient cooperative  Airway and Oxygen Therapy: Patient Spontanous Breathing  Post-op Pain: mild  Post-op Assessment: Patient's Cardiovascular Status Stable, Respiratory Function Stable, No headache, No backache, No residual numbness and No residual motor weakness  Post-op Vital Signs: stable  Last Vitals:  Filed Vitals:   12/28/13 0637  BP: 104/61  Pulse: 98  Temp: 36.7 C  Resp: 18    Complications: No apparent anesthesia complications

## 2013-12-28 NOTE — Progress Notes (Signed)
UR completed 

## 2013-12-28 NOTE — Lactation Note (Signed)
This note was copied from the chart of Jill Darline Munoz. Lactation Consultation Note  P3, Difficulty latching in the past.  Large breasts. Reviewed basics, cluster feeding. Mother states baby has been latching for 10 min. And falls asleep and prefers one side. Encouraged her to keep trying both breasts and mother undress baby for next feeding and massage breast to keep him active. Encouraged mother to call lactation  next feeding. Mom made aware of O/P services, breastfeeding support groups, community resources, and our phone # for post-discharge questions.    Patient Name: [name] [id number] Date: 12/28/2013 Reason for consult: Initial assessment   Maternal Data Has patient been taught Hand Expression?: Yes Does the patient have breastfeeding experience prior to this delivery?: Yes  Feeding Feeding Type: Breast Fed Length of feed: 10 min  LATCH Score/Interventions                      Lactation Tools Discussed/Used     Consult Status Consult Status: Follow-up Date: 12/29/13 Follow-up type: In-patient    Dahlia Byes [facility] 12/28/2013, 10:19 AM

## 2013-12-28 NOTE — Progress Notes (Signed)
Patient is eating, ambulating, voiding.  Pain control is good.  Lochia is appropriate  Filed Vitals:   12/27/13 1931 12/27/13 2359 12/28/13 0637 12/28/13 0825  BP: 124/65 118/67 104/61 121/61  Pulse: 116 115 98 79  Temp: 99.2 F (37.3 C) 98.9 F (37.2 C) 98.1 F (36.7 C)   TempSrc: Oral Oral Oral   Resp: Height:      Weight:      SpO2: 98% 97% 100%     Fundus firm Perineum without swelling. LE: symmetric, 1+ edema b/l  Lab Results  Component Value Date   WBC 15.9* 12/28/2013   HGB 8.3* 12/28/2013   HCT 25.7* 12/28/2013   MCV 84.8 12/28/2013   PLT 199 12/28/2013      A/P Post partum day 1.  Routine care.  Expect d/c tomorrow.   ?CHTN on no meds.  BPs stable, no s/sx of preE  Desires circ.  Medicaid.  Will obtain in office.   [place], [facility]

## 2013-12-28 NOTE — Lactation Note (Signed)
This note was copied from the chart of Jill Gladis Acevedo. Lactation Consultation Note  Baby has a tight lingual frenulum.  Assisted in positioning mother on her side.  Baby latched easily but after 15 min with good depth, mother had blister and "lipstick nipple". Sucks and swallows observed. Did suck training with infant. Mother has her own DEBP.  Mother has pumped and states she is not getting anything.  Reviewed volume amount on day one. Will contact Onalee Hua MD regarding tight frenulum.   Patient Name: [name] [id number] Date: 12/28/2013 Reason for consult: Follow-up assessment   Maternal Data Has patient been taught Hand Expression?: Yes Does the patient have breastfeeding experience prior to this delivery?: Yes  Feeding Feeding Type: Breast Fed Length of feed: 15 min  LATCH Score/Interventions Latch: Grasps breast easily, tongue down, lips flanged, rhythmical sucking. Intervention(s): Waking techniques Intervention(s): Adjust position;Assist with latch;Breast massage;Breast compression  Audible Swallowing: Spontaneous and intermittent  Type of Nipple: Everted at rest and after stimulation  Comfort (Breast/Nipple): Filling, red/small blisters or bruises, mild/mod discomfort  Problem noted: Cracked, bleeding, blisters, bruises Interventions  (Cracked/bleeding/bruising/blister): Expressed breast milk to nipple  Hold (Positioning): Assistance needed to correctly position infant at breast and maintain latch. Intervention(s): Breastfeeding basics reviewed;Support Pillows;Position options;Skin to skin  LATCH Score: 8  Lactation Tools Discussed/Used     Consult Status Consult Status: Follow-up Date: 12/29/13 Follow-up type: In-patient    Dahlia Byes [facility] 12/28/2013, 11:54 AM

## 2013-12-29 MED ORDER — FERROUS SULFATE 325 (65 FE) MG PO TBEC: 325.0000 mg | DELAYED_RELEASE_TABLET | Freq: Two times a day (BID) | ORAL | Status: DC

## 2013-12-29 MED ORDER — OXYCODONE-ACETAMINOPHEN 5-325 MG PO TABS: 1.0000 | ORAL_TABLET | Freq: Four times a day (QID) | ORAL | Status: DC | PRN

## 2013-12-29 MED ORDER — IBUPROFEN 600 MG PO TABS: 600.0000 mg | ORAL_TABLET | Freq: Four times a day (QID) | ORAL | Status: DC | PRN

## 2013-12-29 NOTE — Discharge Instructions (Signed)
°Iron-Rich Diet ° °An iron-rich diet contains foods that are good sources of iron. Iron is an important mineral that helps your body produce hemoglobin. Hemoglobin is a protein in red blood cells that carries oxygen to the body's tissues. Sometimes, the iron level in your blood can be low. This may be caused by: °· A lack of iron in your diet. °· Blood loss. °· Times of growth, such as during pregnancy or during a child's growth and development. °Low levels of iron can cause a decrease in the number of red blood cells. This can result in iron deficiency anemia. Iron deficiency anemia symptoms include: °· Tiredness. °· Weakness. °· Irritability. °· Increased chance of infection. °Here are some recommendations for daily iron intake: °· Males older than 34 years of age need 8 mg of iron per day. °· Women ages 19 to 50 need 18 mg of iron per day. °· Pregnant women need 27 mg of iron per day, and women who are over 19 years of age and breastfeeding need 9 mg of iron per day. °· Women over the age of 50 need 8 mg of iron per day. °SOURCES OF IRON °There are 2 types of iron that are found in food: heme iron and nonheme iron. Heme iron is absorbed by the body better than nonheme iron. Heme iron is found in meat, poultry, and fish. Nonheme iron is found in grains, beans, and vegetables. °Heme Iron Sources °Food / Iron (mg) °· Chicken liver, 3 oz (85 g)/ 10 mg °· Beef liver, 3 oz (85 g)/ 5.5 mg °· Oysters, 3 oz (85 g)/ 8 mg °· Beef, 3 oz (85 g)/ 2 to 3 mg °· Shrimp, 3 oz (85 g)/ 2.8 mg °· Turkey, 3 oz (85 g)/ 2 mg °· Chicken, 3 oz (85 g) / 1 mg °· Fish (tuna, halibut), 3 oz (85 g)/ 1 mg °· Pork, 3 oz (85 g)/ 0.9 mg °Nonheme Iron Sources °Food / Iron (mg) °· Ready-to-eat breakfast cereal, iron-fortified / 3.9 to 7 mg °· Tofu, ½ cup / 3.4 mg °· Kidney beans, ½ cup / 2.6 mg °· Baked potato with skin / 2.7 mg °· Asparagus, ½ cup / 2.2 mg °· Avocado / 2 mg °· Dried peaches, ½ cup / 1.6 mg °· Raisins, ½ cup / 1.5 mg °· Soy milk, 1  cup / 1.5 mg °· Whole-wheat bread, 1 slice / 1.2 mg °· Spinach, 1 cup / 0.8 mg °· Broccoli, ½ cup / 0.6 mg °IRON ABSORPTION °Certain foods can decrease the body's absorption of iron. Try to avoid these foods and beverages while eating meals with iron-containing foods: °· Coffee. °· Tea. °· Fiber. °· Soy. °Foods containing vitamin C can help increase the amount of iron your body absorbs from iron sources, especially from nonheme sources. Eat foods with vitamin C along with iron-containing foods to increase your iron absorption. Foods that are high in vitamin C include many fruits and vegetables. Some good sources are: °· Fresh orange juice. °· Oranges. °· Strawberries. °· Mangoes. °· Grapefruit. °· Red bell peppers. °· Green bell peppers. °· Broccoli. °· Potatoes with skin. °· Tomato juice. °Document Released: 12/01/2004 Document Revised: 07/12/2011 Document Reviewed: 10/08/2010 °ExitCare® Patient Information ©2015 [employer]. This information is not intended to replace advice given to you by your health care provider. Make sure you discuss any questions you have with your health care provider. °Postpartum Care After Vaginal Delivery °After you deliver your newborn (postpartum period), the usual stay   in the hospital is 24-72 hours. If there were problems with your labor or delivery, or if you have other medical problems, you might be in the hospital longer.  °While you are in the hospital, you will receive help and instructions on how to care for yourself and your newborn during the postpartum period.  °While you are in the hospital: °· Be sure to tell your nurses if you have pain or discomfort, as well as where you feel the pain and what makes the pain worse. °· If you had an incision made near your vagina (episiotomy) or if you had some tearing during delivery, the nurses may put ice packs on your episiotomy or tear. The ice packs may help to reduce the pain and swelling. °· If you are breastfeeding, you may  feel uncomfortable contractions of your uterus for a couple of weeks. This is normal. The contractions help your uterus get back to normal size. °· It is normal to have some bleeding after delivery. °¨ For the first 1-3 days after delivery, the flow is red and the amount may be similar to a period. °¨ It is common for the flow to start and stop. °¨ In the first few days, you may pass some small clots. Let your nurses know if you begin to pass large clots or your flow increases. °¨ Do not  flush blood clots down the toilet before having the nurse look at them. °¨ During the next 3-10 days after delivery, your flow should become more watery and pink or brown-tinged in color. °¨ Ten to fourteen days after delivery, your flow should be a small amount of yellowish-white discharge. °¨ The amount of your flow will decrease over the first few weeks after delivery. Your flow may stop in 6-8 weeks. Most women have had their flow stop by 12 weeks after delivery. °· You should change your sanitary pads frequently. °· Wash your hands thoroughly with soap and water for at least 20 seconds after changing pads, using the toilet, or before holding or feeding your newborn. °· You should feel like you need to empty your bladder within the first 6-8 hours after delivery. °· In case you become weak, lightheaded, or faint, call your nurse before you get out of bed for the first time and before you take a shower for the first time. °· Within the first few days after delivery, your breasts may begin to feel tender and full. This is called engorgement. Breast tenderness usually goes away within 48-72 hours after engorgement occurs. You may also notice milk leaking from your breasts. If you are not breastfeeding, do not stimulate your breasts. Breast stimulation can make your breasts produce more milk. °· Spending as much time as possible with your newborn is very important. During this time, you and your newborn can feel close and get to  know each other. Having your newborn stay in your room (rooming in) will help to strengthen the bond with your newborn.  It will give you time to get to know your newborn and become comfortable caring for your newborn. °· Your hormones change after delivery. Sometimes the hormone changes can temporarily cause you to feel sad or tearful. These feelings should not last more than a few days. If these feelings last longer than that, you should talk to your caregiver. °· If desired, talk to your caregiver about methods of family planning or contraception. °· Talk to your caregiver about immunizations. Your caregiver may want you to have   the following immunizations before leaving the hospital: °¨ Tetanus, diphtheria, and pertussis (Tdap) or tetanus and diphtheria (Td) immunization. It is very important that you and your family (including grandparents) or others caring for your newborn are up-to-date with the Tdap or Td immunizations. The Tdap or Td immunization can help protect your newborn from getting ill. °¨ Rubella immunization. °¨ Varicella (chickenpox) immunization. °¨ Influenza immunization. You should receive this annual immunization if you did not receive the immunization during your pregnancy. °Document Released: 02/14/2007 Document Revised: 01/12/2012 Document Reviewed: 12/15/2011 °ExitCare® Patient Information ©2015 [employer]. This information is not intended to replace advice given to you by your health care provider. Make sure you discuss any questions you have with your health care provider. ° °

## 2013-12-29 NOTE — Progress Notes (Signed)
Patient is eating, ambulating, voiding.  Pain control is good.  Lochia is appropriate  Filed Vitals:   12/28/13 0637 12/28/13 0825 12/28/13 1727 12/29/13 0540  BP: 104/61 121/61 114/61 119/78  Pulse: 98 79 82 92  Temp: 98.1 F (36.7 C)  98.1 F (36.7 C) 98.2 F (36.8 C)  TempSrc: Oral  Oral Oral  Resp: Height:      Weight:      SpO2: 100%       Fundus firm Peripad w moderate heme LE: symmetric, 1+ edema b/l  Lab Results  Component Value Date   WBC 15.9* 12/28/2013   HGB 8.3* 12/28/2013   HCT 25.7* 12/28/2013   MCV 84.8 12/28/2013   PLT 199 12/28/2013      A/P Post partum day 2.  Routine care.  D/c today.   ?CHTN on no meds.  BPs stable, no s/sx of preE  Desires circ.  Medicaid.  Will obtain in office.   [place], [facility]

## 2013-12-29 NOTE — Discharge Summary (Signed)
Obstetric Discharge Summary Reason for Admission: rupture of membranes Prenatal Procedures: none Intrapartum Procedures: spontaneous vaginal delivery Postpartum Procedures: none Complications-Operative and Postpartum: 2 degree perineal laceration Hemoglobin  Date Value Ref Range Status  12/28/2013 8.3* 12.0 - 15.0 g/dL Final     DELTA CHECK NOTED     REPEATED TO VERIFY     HCT  Date Value Ref Range Status  12/28/2013 25.7* 36.0 - 46.0 % Final    Physical Exam:  General: alert, cooperative and no distress Lochia: appropriate Uterine Fundus: firm  DVT Evaluation: No evidence of DVT seen on physical exam.  Discharge Diagnoses: Term Pregnancy-delivered  Discharge Information: Date: 12/29/2013 Activity: pelvic rest Diet: routine Medications: Ibuprofen, Colace, Iron and Percocet Condition: stable Instructions: refer to practice specific booklet Discharge to: home Follow-up Information   Follow up with PINN, Jill Mae, MD In 4 weeks.   Specialty:  Obstetrics and Gynecology   Contact information:   [address] [place] [address] [phone]       Newborn Data: Live born female  Birth Weight: 7 lb 1.2 oz (3209 g) APGAR: 7, 9  Home with mother.  [name] 12/29/2013, 9:14 AM

## 2014-02-08 ENCOUNTER — Other Ambulatory Visit: Payer: Self-pay | Admitting: Obstetrics & Gynecology

## 2014-02-11 LAB — CYTOLOGY - PAP

## 2014-02-19 ENCOUNTER — Other Ambulatory Visit: Payer: Self-pay | Admitting: Obstetrics & Gynecology

## 2014-03-04 ENCOUNTER — Encounter (HOSPITAL_COMMUNITY): Payer: Self-pay | Admitting: *Deleted

## 2014-05-17 ENCOUNTER — Encounter (HOSPITAL_COMMUNITY): Payer: Self-pay | Admitting: Emergency Medicine

## 2014-05-17 ENCOUNTER — Emergency Department (INDEPENDENT_AMBULATORY_CARE_PROVIDER_SITE_OTHER)
Admission: EM | Admit: 2014-05-17 | Discharge: 2014-05-17 | Disposition: A | Discharge status: Home / Self Care | Payer: Self-pay | Source: Home / Self Care | Attending: Family Medicine | Admitting: Family Medicine

## 2014-05-17 DIAGNOSIS — J0100 Acute maxillary sinusitis, unspecified: Secondary | ICD-10-CM

## 2014-05-17 MED ORDER — AZITHROMYCIN 250 MG PO TABS: 250.0000 mg | ORAL_TABLET | Freq: Every day | ORAL | Status: DC

## 2014-05-17 MED ORDER — PREDNISONE 10 MG PO TABS: 30.0000 mg | ORAL_TABLET | Freq: Every day | ORAL | Status: DC

## 2014-05-17 NOTE — ED Notes (Signed)
C/o body aches, sore throat, diarrhea, and sinus infection

## 2014-05-17 NOTE — ED Provider Notes (Signed)
[name] is a 34 y.o. female who presents to Urgent Care today for body aches diarrhea facial pain and pressure and nasal discharge. Patient is also complaining of sore throat. Symptoms of an present for a few days. Facial pains bilaterally. Patient denies any cough or chest pain or shortness of breath. No abdominal pain or vomiting. No bloody diarrhea. Patient has tried some over-the-counter medications which helps some.   Past Medical History  Diagnosis Date  . GERD (gastroesophageal reflux disease)   . Indigestion   . Ectopic pregnancy   . Headache(784.0)   . Infection     UTI  . Fibromyalgia     'had it before' no problems in 5-5037yrs  . Pregnancy induced hypertension     with first   Past Surgical History  Procedure Laterality Date  . Cesarean section    . Therapeutic abortion    . Cardiac catheterization      not a cardiac problem- really bad acid reflex  . Cholecystectomy N/A 10/11/2013    Procedure: LAPAROSCOPIC CHOLECYSTECTOMY;  Surgeon: Shelly Rubensteinouglas A Blackman, MD;  Location: [facility];  Service: General;  Laterality: N/A;  . Cardiac catheterization  2006   History  Substance Use Topics  . Smoking status: Former Games developermoker  . Smokeless tobacco: Never Used     Comment: quit 2009  . Alcohol Use: No   ROS as above Medications: No current facility-administered medications for this encounter.   Current Outpatient Prescriptions  Medication Sig Dispense Refill  . azithromycin (ZITHROMAX) 250 MG tablet Take 1 tablet (250 mg total) by mouth daily. Take first 2 tablets together, then 1 every day until finished. 6 tablet 0  . cetirizine (ZYRTEC) 10 MG tablet Take 10 mg by mouth daily as needed for allergies or rhinitis.     . ferrous sulfate 325 (65 FE) MG EC tablet Take 1 tablet (325 mg total) by mouth 2 (two) times daily with a meal. 60 tablet 2  . ibuprofen (ADVIL,MOTRIN) 600 MG tablet Take 1 tablet (600 mg total) by mouth every 6 (six) hours as needed (pain scale < 4). 30 tablet  0  . predniSONE (DELTASONE) 10 MG tablet Take 3 tablets (30 mg total) by mouth daily. 15 tablet 0  . Prenatal Vit-Min-FA-Fish Oil ([employer] PRENATAL GUMMY PO) Take 1 each by mouth 2 (two) times daily.    . ranitidine (ZANTAC) 150 MG tablet Take 300 mg by mouth 2 (two) times daily.    . [DISCONTINUED] sodium chloride (OCEAN) 0.65 % nasal spray Place 1 spray into the nose as needed for congestion. 15 mL 12   Allergies  Allergen Reactions  . Latex Hives, Itching and Rash  . Tomato Itching     Exam:  BP 126/74 mmHg  Pulse 78  Temp(Src) 98.2 F (36.8 C) (Oral)  Resp 16  SpO2 98% Gen: Well NAD HEENT: EOMI,  MMM clear nasal discharge with inflamed nasal turbinates bilaterally. Nontender bilateral maxillary sinuses. Posterior pharynx is normal appearing. Tympanic membranes are normal appearing bilaterally. Lungs: Normal work of breathing. CTABL Heart: RRR no MRG Abd: NABS, Soft. Nondistended, Nontender Exts: Brisk capillary refill, warm and well perfused.   No results found for this or any previous visit (from the past 24 hour(s)). No results found.  Assessment and Plan: 34 y.o. female with viral URI with sinus pain and pressure. Patient also has diarrhea associated with the virus. Discussed options. Treat with prednisone as patient is experiencing significant facial pain and pressure. Recommend Tylenol or  ibuprofen for body aches. Use over-the-counter Imodium for diarrhea as needed. Follow-up with PCP. Treat with azithromycin antibiotics if not improving.  Discussed warning signs or symptoms. Please see discharge instructions. Patient expresses understanding.     Rodolph Bong, MD 05/17/14 1224

## 2014-05-17 NOTE — Discharge Instructions (Signed)
Thank you for coming in today. Call or go to the emergency room if you get worse, have trouble breathing, have chest pains, or palpitations.  Take prednisone daily,.  Take azithromycin if not better.   Sinusitis Sinusitis is redness, soreness, and inflammation of the paranasal sinuses. Paranasal sinuses are air pockets within the bones of your face (beneath the eyes, the middle of the forehead, or above the eyes). In healthy paranasal sinuses, mucus is able to drain out, and air is able to circulate through them by way of your nose. However, when your paranasal sinuses are inflamed, mucus and air can become trapped. This can allow bacteria and other germs to grow and cause infection. Sinusitis can develop quickly and last only a short time (acute) or continue over a long period (chronic). Sinusitis that lasts for more than 12 weeks is considered chronic.  CAUSES  Causes of sinusitis include:  Allergies.  Structural abnormalities, such as displacement of the cartilage that separates your nostrils (deviated septum), which can decrease the air flow through your nose and sinuses and affect sinus drainage.  Functional abnormalities, such as when the small hairs (cilia) that line your sinuses and help remove mucus do not work properly or are not present. SIGNS AND SYMPTOMS  Symptoms of acute and chronic sinusitis are the same. The primary symptoms are pain and pressure around the affected sinuses. Other symptoms include:  Upper toothache.  Earache.  Headache.  Bad breath.  Decreased sense of smell and taste.  A cough, which worsens when you are lying flat.  Fatigue.  Fever.  Thick drainage from your nose, which often is green and may contain pus (purulent).  Swelling and warmth over the affected sinuses. DIAGNOSIS  Your health care provider will perform a physical exam. During the exam, your health care provider may:  Look in your nose for signs of abnormal growths in your nostrils  (nasal polyps).  Tap over the affected sinus to check for signs of infection.  View the inside of your sinuses (endoscopy) using an imaging device that has a light attached (endoscope). If your health care provider suspects that you have chronic sinusitis, one or more of the following tests may be recommended:  Allergy tests.  Nasal culture. A sample of mucus is taken from your nose, sent to a lab, and screened for bacteria.  Nasal cytology. A sample of mucus is taken from your nose and examined by your health care provider to determine if your sinusitis is related to an allergy. TREATMENT  Most cases of acute sinusitis are related to a viral infection and will resolve on their own within 10 days. Sometimes medicines are prescribed to help relieve symptoms (pain medicine, decongestants, nasal steroid sprays, or saline sprays).  However, for sinusitis related to a bacterial infection, your health care provider will prescribe antibiotic medicines. These are medicines that will help kill the bacteria causing the infection.  Rarely, sinusitis is caused by a fungal infection. In theses cases, your health care provider will prescribe antifungal medicine. For some cases of chronic sinusitis, surgery is needed. Generally, these are cases in which sinusitis recurs more than 3 times per year, despite other treatments. HOME CARE INSTRUCTIONS   Drink plenty of water. Water helps thin the mucus so your sinuses can drain more easily.  Use a humidifier.  Inhale steam 3 to 4 times a day (for example, sit in the bathroom with the shower running).  Apply a warm, moist washcloth to your face 3  to 4 times a day, or as directed by your health care provider.  Use saline nasal sprays to help moisten and clean your sinuses.  Take medicines only as directed by your health care provider.  If you were prescribed either an antibiotic or antifungal medicine, finish it all even if you start to feel better. SEEK  IMMEDIATE MEDICAL CARE IF:  You have increasing pain or severe headaches.  You have nausea, vomiting, or drowsiness.  You have swelling around your face.  You have vision problems.  You have a stiff neck.  You have difficulty breathing. MAKE SURE YOU:   Understand these instructions.  Will watch your condition.  Will get help right away if you are not doing well or get worse. Document Released: 04/19/2005 Document Revised: 09/03/2013 Document Reviewed: 05/04/2011 [facility] Patient Information 2015 [employer], [place]. This information is not intended to replace advice given to you by your health care provider. Make sure you discuss any questions you have with your health care provider.

## 2014-07-03 ENCOUNTER — Encounter (HOSPITAL_COMMUNITY): Payer: Self-pay

## 2014-07-03 ENCOUNTER — Emergency Department (HOSPITAL_COMMUNITY)
Admission: EM | Admit: 2014-07-03 | Discharge: 2014-07-03 | Disposition: A | Discharge status: Home / Self Care | Payer: Medicaid Other | Attending: Emergency Medicine | Admitting: Emergency Medicine

## 2014-07-03 DIAGNOSIS — Z91040 Latex allergy status: Secondary | ICD-10-CM | POA: Insufficient documentation

## 2014-07-03 DIAGNOSIS — N390 Urinary tract infection, site not specified: Secondary | ICD-10-CM

## 2014-07-03 DIAGNOSIS — Z87891 Personal history of nicotine dependence: Secondary | ICD-10-CM | POA: Insufficient documentation

## 2014-07-03 DIAGNOSIS — Z8719 Personal history of other diseases of the digestive system: Secondary | ICD-10-CM | POA: Insufficient documentation

## 2014-07-03 DIAGNOSIS — R319 Hematuria, unspecified: Secondary | ICD-10-CM | POA: Diagnosis present

## 2014-07-03 DIAGNOSIS — Z3202 Encounter for pregnancy test, result negative: Secondary | ICD-10-CM | POA: Diagnosis not present

## 2014-07-03 DIAGNOSIS — M5442 Lumbago with sciatica, left side: Secondary | ICD-10-CM | POA: Diagnosis not present

## 2014-07-03 DIAGNOSIS — Z9889 Other specified postprocedural states: Secondary | ICD-10-CM | POA: Diagnosis not present

## 2014-07-03 DIAGNOSIS — Z9049 Acquired absence of other specified parts of digestive tract: Secondary | ICD-10-CM | POA: Diagnosis not present

## 2014-07-03 DIAGNOSIS — Z79899 Other long term (current) drug therapy: Secondary | ICD-10-CM | POA: Diagnosis not present

## 2014-07-03 LAB — URINALYSIS, ROUTINE W REFLEX MICROSCOPIC
Bilirubin Urine: NEGATIVE
Glucose, UA: NEGATIVE mg/dL
Ketones, ur: NEGATIVE mg/dL
Nitrite: NEGATIVE
PH: 5.5 (ref 5.0–8.0)
Protein, ur: 30 mg/dL — AB
Specific Gravity, Urine: 1.025 (ref 1.005–1.030)
Urobilinogen, UA: 0.2 mg/dL (ref 0.0–1.0)

## 2014-07-03 LAB — PREGNANCY, URINE: Preg Test, Ur: NEGATIVE

## 2014-07-03 LAB — URINE MICROSCOPIC-ADD ON

## 2014-07-03 MED ORDER — PREDNISONE 10 MG PO TABS: ORAL_TABLET | ORAL | Status: DC

## 2014-07-03 MED ORDER — MELOXICAM 15 MG PO TABS
15.0000 mg | ORAL_TABLET | Freq: Every day | ORAL | Status: DC
Start: 2014-07-03 — End: 2015-01-15

## 2014-07-03 MED ORDER — METHOCARBAMOL 500 MG PO TABS: 500.0000 mg | ORAL_TABLET | Freq: Two times a day (BID) | ORAL | Status: DC

## 2014-07-03 MED ORDER — CEPHALEXIN 500 MG PO CAPS: 500.0000 mg | ORAL_CAPSULE | Freq: Four times a day (QID) | ORAL | Status: DC

## 2014-07-03 NOTE — Discharge Instructions (Signed)
1. Medications: robaxin, mobic, prednisone, keflex, usual home medications 2. Treatment: rest, drink plenty of fluids, gentle stretching as discussed, alternate ice and heat 3. Follow Up: Please followup with your primary doctor in 3 days for discussion of your diagnoses and further evaluation after today's visit; if you do not have a primary care doctor use the resource guide provided to find one;  Return to the ER for worsening back pain, difficulty walking, loss of bowel or bladder control or other concerning symptoms     Back Exercises Back exercises help treat and prevent back injuries. The goal of back exercises is to increase the strength of your abdominal and back muscles and the flexibility of your back. These exercises should be started when you no longer have back pain. Back exercises include:  Pelvic Tilt. Lie on your back with your knees bent. Tilt your pelvis until the lower part of your back is against the floor. Hold this position 5 to 10 sec and repeat 5 to 10 times.  Knee to Chest. Pull first 1 knee up against your chest and hold for 20 to 30 seconds, repeat this with the other knee, and then both knees. This may be done with the other leg straight or bent, whichever feels better.  Sit-Ups or Curl-Ups. Bend your knees 90 degrees. Start with tilting your pelvis, and do a partial, slow sit-up, lifting your trunk only 30 to 45 degrees off the floor. Take at least 2 to 3 seconds for each sit-up. Do not do sit-ups with your knees out straight. If partial sit-ups are difficult, simply do the above but with only tightening your abdominal muscles and holding it as directed.  Hip-Lift. Lie on your back with your knees flexed 90 degrees. Push down with your feet and shoulders as you raise your hips a couple inches off the floor; hold for 10 seconds, repeat 5 to 10 times.  Back arches. Lie on your stomach, propping yourself up on bent elbows. Slowly press on your hands, causing an arch in  your low back. Repeat 3 to 5 times. Any initial stiffness and discomfort should lessen with repetition over time.  Shoulder-Lifts. Lie face down with arms beside your body. Keep hips and torso pressed to floor as you slowly lift your head and shoulders off the floor. Do not overdo your exercises, especially in the beginning. Exercises may cause you some mild back discomfort which lasts for a few minutes; however, if the pain is more severe, or lasts for more than 15 minutes, do not continue exercises until you see your caregiver. Improvement with exercise therapy for back problems is slow.  See your caregivers for assistance with developing a proper back exercise program. Document Released: 05/27/2004 Document Revised: 07/12/2011 Document Reviewed: 02/18/2011 [facility] Patient Information 2015 [place], [place]. This information is not intended to replace advice given to you by your health care provider. Make sure you discuss any questions you have with your health care provider.    Emergency Department Resource Guide 1) Find a Doctor and Pay Out of Pocket Although you won't have to find out who is covered by your insurance plan, it is a good idea to ask around and get recommendations. You will then need to call the office and see if the doctor you have chosen will accept you as a new patient and what types of options they offer for patients who are self-pay. Some doctors offer discounts or will set up payment plans for their patients who do  not have insurance, but you will need to ask so you aren't surprised when you get to your appointment.  2) Contact Your Local Health Department Not all health departments have doctors that can see patients for sick visits, but many do, so it is worth a call to see if yours does. If you don't know where your local health department is, you can check in your phone book. The CDC also has a tool to help you locate your state's health department, and many state websites  also have listings of all of their local health departments.  3) Find a Walk-in Clinic If your illness is not likely to be very severe or complicated, you may want to try a walk in clinic. These are popping up all over the country in pharmacies, drugstores, and shopping centers. They're usually staffed by nurse practitioners or physician assistants that have been trained to treat common illnesses and complaints. They're usually fairly quick and inexpensive. However, if you have serious medical issues or chronic medical problems, these are probably not your best option.  No Primary Care Doctor: - Call Health Connect at  [phone] - they can help you locate a primary care doctor that  accepts your insurance, provides certain services, etc. - Physician Referral Service- [phone]  Chronic Pain Problems: Organization         Address  Phone   Notes  [facility]  [phone] Patients need to be referred by their primary care doctor.   Medication Assistance: Organization         Address  Phone   Notes  [facility] Medication [facility] [address] [place], [place] [address] [phone] --Must be a resident of [facility] -- Must have NO insurance coverage whatsoever (no Medicaid/ Medicare, etc.) -- The pt. MUST have a primary care doctor that directs their care regularly and follows them in the community   [facility]ist  [phone]   [employer]  [phone]    Agencies that provide inexpensive medical care: Organization         Address  Phone   Notes  [facility]  [phone]   [facility]    [phone]   [facility] [address] [place], [place] [address] [phone]   [facility] [address] [place], [place] [phone]   Planned Parenthood    [phone]   [facility]    [phone]   [facility]   [address], [place] Phone:  [phone], Fax:  [phone] Hours of Operation:  9 am - 6 pm, M-F.  Also accepts Medicaid/Medicare and self-pay.  [facility]  [address], [place] Phone: [phone], Fax: [phone]. Hours of Operation:  8:30 am - 5:30 pm, M-F.  Also accepts Medicaid and self-pay.  [facility] [place] [address] [place] Phone: [phone]   [facility] [address] Natasha Bence [place], [place] [phone], Ext. [phone] Mondays & Thursdays: 7-9 AM.  First 15 patients are seen on a first come, first serve basis.    Medicaid-accepting [facility] Providers:  Organization         Address  Phone   Notes  [facility] [address], [place] [phone] Also accepts self-pay patients.  [facility] [phone]  [address], [place]  [phone](336) [phone]   [facility] [address], [place] [phone]   [facility] [address] [place] [address], [place] [phone]) [phone]   Renaye RakersVeita Bland [address], [place]   [phone] Only accepts [place] Access [place] patients after they have their name applied to their card.   Self-Pay (no insurance) in [facility] [place]:  Organization         Address  Phone   Notes  Sickle Cell Patients, [facility] [address], [place] [phone]   [facility] : [facility] [address], [place] [phone]   [facility]  [address], [place] [phone]) [phone]   [facility]/Dr. Osei-Bonsu  [address] [place] [address], [place] or [phone], [place] [phone]) [phone] Phone number for both [place] and [place] locations is the same.  [facility] [address], [place] [phone](336) [phone]   [facility] [place] [address] [place] [address],  [place] or [address] [phone]   [facility] [address], [place] [phone]) [phone], phone; [phone](336) [phone], fax Sees patients 1st and 3rd Saturday of every month.  Must not qualify for public or private insurance (i.e. Medicaid, Medicare, [place] Health Choice, Veterans' Benefits)  Household income should be no more than 200% of the poverty level The clinic cannot treat you if you are pregnant or think you are pregnant  Sexually transmitted diseases are not treated at the clinic.    Dental Care: Organization         Address  Phone  Notes  [facility] [address], [place] [phone] Accepts children up to age 35 who are enrolled in [place] or [place] Health Choice; pregnant women with a Medicaid card; and children who have applied for Medicaid or [place] Health Choice, but were declined, whose parents can pay a reduced fee at time of service.  [facility] [place]  [address], [place] [phone] Accepts children up to age 35 who are enrolled in [place] or [place] Health Choice; pregnant women with a Medicaid card; and children who have applied for Medicaid or [place] Health Choice, but were declined, whose parents can pay a reduced fee at time of service.  [facility]  [address], [place] [phone] Patients are seen by appointment only. Walk-ins are not accepted. [facility] will see patients 35 years of age and older. Monday - Tuesday (8am-5pm) Most Wednesdays (8:30-5pm) $30 per visit, cash only  [facility]  [address], [place] [facility] [place] [phone] Patients are seen by appointment only. Walk-ins are not accepted. [facility] will see patients 35 years of age and older. One Wednesday Evening (Monthly: Volunteer Based).  $30 per visit, cash only  [employer] [facility]  [phone] for adults; Children under age 424, call Graduate Pediatric Dentistry at [phone]. Children aged 294-14, please call [phone] to request a pediatric application.  Dental services are provided in all areas of dental care including fillings, crowns and bridges, complete and partial dentures, implants, gum treatment, root canals, and extractions. Preventive care is also provided. Treatment is provided to both adults and children. Patients are  selected via a lottery and there is often a waiting list.   [facility] [address] [place]  [phone] [email or web address]   [facility] [address] [place], [place] [phone], Ext. [phone] Second and Fourth Thursday of each month, opens at 6:30 AM; Clinic ends at 9 AM.  Patients are seen on a first-come first-served basis, and a limited number are seen during each clinic.   [facility]  [address] Ether Griffins [place], [place] [phone]   Eligibility Requirements You must have lived in [place], [place], or [place] counties for at least the last three months.   You cannot be eligible for state or federal sponsored [place], including [employer], [place], or [employer].   You generally cannot be eligible for healthcare insurance through your employer.    How to apply: Eligibility screenings are held every Tuesday and Wednesday afternoon from 1:00 pm until 4:00 pm. You do not need an appointment for the interview!  [facility] [address] [place], [place] [phone]   [facility] Health Department  [phone]   [facility] Health Department  [phone]   [facility] Health Department  [phone]    Behavioral Health Resources in the Community: Intensive Outpatient Programs Organization         Address  Phone  Notes  [facility] [address] [place], [place] [phone]     [facility] [address] [place], [place] [phone]   [facility] [address], [place], [place]  [phone]   [facility] Mental Health [address],  [place], [place] [phone] or [phone]   Substance Abuse Resources Organization         Address  Phone  Notes  [facility]  [phone]   [facility]  [phone])[phone]   [facility] [place]  [phone]   Floydene Flock  [phone]   Residential & Outpatient Substance Abuse Program  [phone]   Psychological Services Organization         Address  Phone  Notes  [facility]  [phone]   [facility] Services  [phone]   [facility] [address], [place] [phone] or [phone]    Mobile Crisis Teams Organization         Address  Phone  Notes  [facility], Mobile Crisis Care Unit  [phone]   [facility]  [address] [place], [place] [phone]   Doristine Locks [address] [place] [address] [place] [phone]    Self-Help/Support Groups Organization         Address  Phone             Notes  [facility] [place] - variety of support groups  [phone]- I7437963 Call for more information  Narcotics Anonymous (NA), [facility] [address], [employer] [place]  2 meetings at this location   Statistician         Address  Phone  Notes  [facility] [address],    [place]  [phone]   [facility]  [address] [place] [address], [place], [place] [phone]   [facility] [address] [place], [place] [phone] Admissions: 8am-3pm M-F  [facility] [address]    [place], [place] [phone]   [facility] [address] Leonard Schwartz [place], [place] [phone]   [facility] [address],  [place],  [place] [phone]   [facility] - Intensive Outpatient [address], [place], [place] [phone]   [facility] ([facility]iction [facility] [address]  [place], [place] [phone] or [phone]   [facility] ([facility]) [address] [place], [place] [phone] Accepts Medicaid  [facility] [place] [address]  [place] [phone] Substance Abuse/Addiction Treatment   [facility] Organization         Address  Phone  Notes  [facility]  [phone]   Angie Fava, PhD [address] Ervin Knack [place], [place]   [phone] or [phone]   [facility] Behavioral   [address] [place], [place] [phone]   [facility] [address] [place], [place] [phone] Insurance/Medicaid/sponsorship through [facility] [address]                                    [place], [place] [phone] Therapy/tele-psych/case  [facility] [address], [place] [phone]    Dr. Lolly Mustache  [phone]   [facility] [place]  [employer] [facility] Dept. 1) [address], [place] 2) [address], [place] 3)  [address], [place] [phone]  [phone]   [facility] Child Abuse Hotline [phone]) [phone] or [phone] (After Hours)

## 2014-07-03 NOTE — ED Provider Notes (Signed)
CSN: [id number]     Arrival date & time 07/03/14  2000 History   First MD Initiated Contact with Patient 07/03/14 2129     Chief Complaint  Patient presents with  . Hematuria     (Consider location/radiation/quality/duration/timing/severity/associated sxs/prior Treatment) The history is provided by the patient and medical records. No language interpreter was used.     [name] is a 34 y.o. female  with a hx of GERD, fibromyalgia presents to the Emergency Department complaining of gradual, persistent, progressively worsening dysuria onset last night.  Pt reports that she has seen hematuria with each urination, worst at the end of urination.  Pt with associated supropubic abd cramping.  She denies fever, chills, flank pain, Nausea, vomiting, diarrhea. Pt is sexually active with her husband and denies painful intercourse, vaginal discharge, vaginal dryness.  LMP: 06/28/14  Pt also c/o low back pain that radiates to the left buttock and  pelvic pain since her vaginal delivery 6 mos ago.  Pt has seen her OB/GYN since that time and is taking ibuprofen and tylenol with intermittent relief.  Pt reports that lifting her 7 mo old and 35 year old makes her back pain worse.  She denies loss of bowel or bladder control, gait disturbance, numbness, weakness.  Past Medical History  Diagnosis Date  . GERD (gastroesophageal reflux disease)   . Indigestion   . Ectopic pregnancy   . Headache(784.0)   . Infection     UTI  . Fibromyalgia     'had it before' no problems in 5-6yrs  . Pregnancy induced hypertension     with first   Past Surgical History  Procedure Laterality Date  . Cesarean section    . Therapeutic abortion    . Cardiac catheterization      not a cardiac problem- really bad acid reflex  . Cholecystectomy N/A 10/11/2013    Procedure: LAPAROSCOPIC CHOLECYSTECTOMY;  Surgeon: Shelly Rubenstein, MD;  Location: [facility];  Service: General;  Laterality: N/A;  . Cardiac catheterization   2006   Family History  Problem Relation Age of Onset  . Diabetes Maternal Aunt   . Cancer Maternal Aunt     mother's aunt Br Ca  . Diabetes Maternal Grandmother   . Hypertension Maternal Grandmother   . Heart disease Maternal Grandmother   . Other Neg Hx    History  Substance Use Topics  . Smoking status: Former Games developer  . Smokeless tobacco: Never Used     Comment: quit 2009  . Alcohol Use: No   OB History    Gravida Para Term Preterm AB TAB SAB Ectopic Multiple Living   Review of Systems  Constitutional: Negative for fever, diaphoresis, appetite change, fatigue and unexpected weight change.  HENT: Negative for mouth sores.   Eyes: Negative for visual disturbance.  Respiratory: Negative for cough, chest tightness, shortness of breath and wheezing.   Cardiovascular: Negative for chest pain.  Gastrointestinal: Positive for abdominal pain. Negative for nausea, vomiting, diarrhea and constipation.  Endocrine: Negative for polydipsia, polyphagia and polyuria.  Genitourinary: Positive for dysuria and hematuria. Negative for urgency and frequency.  Musculoskeletal: Positive for back pain. Negative for neck stiffness.  Skin: Negative for rash.  Allergic/Immunologic: Negative for immunocompromised state.  Neurological: Negative for syncope, light-headedness and headaches.  Hematological: Does not bruise/bleed easily.  Psychiatric/Behavioral: Negative for sleep disturbance. The patient is not nervous/anxious.  Allergies  Latex and Tomato  Home Medications   Prior to Admission medications   Medication Sig Start Date End Date Taking? Authorizing Provider  acetaminophen (TYLENOL) 325 MG tablet Take 650 mg by mouth every 6 (six) hours as needed for moderate pain.   Yes Historical Provider, MD  ibuprofen (ADVIL,MOTRIN) 600 MG tablet Take 1 tablet (600 mg total) by mouth every 6 (six) hours as needed (pain scale < 4). 12/29/13  Yes Marlow Baars, MD   azithromycin (ZITHROMAX) 250 MG tablet Take 1 tablet (250 mg total) by mouth daily. Take first 2 tablets together, then 1 every day until finished. Patient not taking: Reported on 07/03/2014 05/17/14   Rodolph Bong, MD  cephALEXin (KEFLEX) 500 MG capsule Take 1 capsule (500 mg total) by mouth 4 (four) times daily. 07/03/14   Kaylamarie Swickard, PA-C  ferrous sulfate 325 (65 FE) MG EC tablet Take 1 tablet (325 mg total) by mouth 2 (two) times daily with a meal. Patient not taking: Reported on 07/03/2014 12/29/13   Marlow Baars, MD  meloxicam (MOBIC) 15 MG tablet Take 1 tablet (15 mg total) by mouth daily. 07/03/14   Dezmond Downie, PA-C  methocarbamol (ROBAXIN) 500 MG tablet Take 1 tablet (500 mg total) by mouth 2 (two) times daily. 07/03/14   Becker Christopher, PA-C  predniSONE (DELTASONE) 10 MG tablet 4 tabs po daily x 3 days, then 3 tabs x 3 days, then 2 tabs x 3 days, then 1 tab x 3 days, then 0.5 tabs x 4 days 07/03/14   Dahlia Client Hiroto Saltzman, PA-C   BP 128/64 mmHg  Pulse 85  Temp(Src) 98.3 F (36.8 C) (Oral)  Resp 18  SpO2 100%  LMP 06/28/2014 Physical Exam  Constitutional: She appears well-developed and well-nourished. No distress.  Awake, alert, nontoxic appearance  HENT:  Head: Normocephalic and atraumatic.  Mouth/Throat: Oropharynx is clear and moist. No oropharyngeal exudate.  Eyes: Conjunctivae are normal. No scleral icterus.  Neck: Normal range of motion. Neck supple.  Full ROM without pain  Cardiovascular: Normal rate, regular rhythm, normal heart sounds and intact distal pulses.   No murmur heard. Pulmonary/Chest: Effort normal and breath sounds normal. No respiratory distress. She has no wheezes.  Equal chest expansion  Abdominal: Soft. Bowel sounds are normal. She exhibits no distension and no mass. There is tenderness in the suprapubic area. There is no rebound and no guarding.  Musculoskeletal: Normal range of motion. She exhibits no edema.  Full range of motion of the  T-spine and L-spine No tenderness to palpation of the spinous processes of the T-spine or L-spine Mild tenderness to palpation of the left paraspinous muscles of the L-spine Negative straight leg raise.   Lymphadenopathy:    She has no cervical adenopathy.  Neurological: She is alert. She has normal reflexes.  Reflex Scores:      Bicep reflexes are 2+ on the right side and 2+ on the left side.      Brachioradialis reflexes are 2+ on the right side and 2+ on the left side.      Patellar reflexes are 2+ on the right side and 2+ on the left side.      Achilles reflexes are 2+ on the right side and 2+ on the left side. Speech is clear and goal oriented, follows commands Normal 5/5 strength in upper and lower extremities bilaterally including dorsiflexion and plantar flexion, strong and equal grip strength Sensation normal to light and sharp touch Moves extremities without ataxia, coordination intact  Normal gait Normal balance No Clonus   Skin: Skin is warm and dry. No rash noted. She is not diaphoretic. No erythema.  Psychiatric: She has a normal mood and affect. Her behavior is normal.  Nursing note and vitals reviewed.   ED Course  Procedures (including critical care time) Labs Review Labs Reviewed  URINALYSIS, ROUTINE W REFLEX MICROSCOPIC - Abnormal; Notable for the following:    APPearance CLOUDY (*)    Hgb urine dipstick LARGE (*)    Protein, ur 30 (*)    Leukocytes, UA SMALL (*)    All other components within normal limits  URINE MICROSCOPIC-ADD ON - Abnormal; Notable for the following:    Squamous Epithelial / LPF MANY (*)    Bacteria, UA MANY (*)    All other components within normal limits  PREGNANCY, URINE    Imaging Review No results found.   EKG Interpretation None      MDM   Final diagnoses:  UTI (lower urinary tract infection)  Left-sided low back pain with left-sided sciatica   [facility] presents with nontraumatic back pain persistent for the  last 6 months.  Pt's pain is unchanged tonight, but pt reports the ibuprofen just isn't helping enough.    No neurological deficits and normal neuro exam.  Patient can walk but states is painful.  No loss of bowel or bladder control.  No concern for cauda equina.  No fever, night sweats, weight loss, h/o cancer, IVDU.  RICE protocol and pain medicine indicated and discussed with patient.   Pt has been diagnosed with a UTI. Pt is afebrile, no CVA tenderness, normotensive, and denies N/V. Pt to be dc home with antibiotics and instructions to follow up with PCP if symptoms persist.   I have personally reviewed patient's vitals, nursing note and any pertinent labs or imaging.  I performed an undressed physical exam.    It has been determined that no acute conditions requiring further emergency intervention are present at this time. The patient/guardian have been advised of the diagnosis and plan. I reviewed all labs and imaging including any potential incidental findings. We have discussed signs and symptoms that warrant return to the ED and they are listed in the discharge instructions.    Vital signs are stable at discharge.   BP 128/64 mmHg  Pulse 85  Temp(Src) 98.3 F (36.8 C) (Oral)  Resp 18  SpO2 100%  LMP 06/28/2014        Dahlia ClientHannah Sayyid Harewood, PA-C 07/03/14 2310  Flint MelterElliott L Wentz, MD 07/04/14 [phone]

## 2014-07-03 NOTE — ED Notes (Addendum)
Pt complains of blood in her urine and low back pain for three days, pt complains of pain when she urinates

## 2014-08-27 ENCOUNTER — Emergency Department (HOSPITAL_COMMUNITY)
Admission: EM | Admit: 2014-08-27 | Discharge: 2014-08-27 | Disposition: A | Discharge status: Home / Self Care | Payer: Self-pay

## 2014-08-27 ENCOUNTER — Emergency Department (HOSPITAL_COMMUNITY): Payer: Self-pay

## 2014-08-27 ENCOUNTER — Emergency Department (HOSPITAL_COMMUNITY)
Admission: EM | Admit: 2014-08-27 | Discharge: 2014-08-27 | Disposition: A | Discharge status: Home / Self Care | Payer: Self-pay | Attending: Emergency Medicine | Admitting: Emergency Medicine

## 2014-08-27 ENCOUNTER — Encounter (HOSPITAL_COMMUNITY): Payer: Self-pay | Admitting: Emergency Medicine

## 2014-08-27 ENCOUNTER — Emergency Department (HOSPITAL_COMMUNITY): Payer: Medicaid Other

## 2014-08-27 DIAGNOSIS — Z87891 Personal history of nicotine dependence: Secondary | ICD-10-CM | POA: Insufficient documentation

## 2014-08-27 DIAGNOSIS — N3090 Cystitis, unspecified without hematuria: Secondary | ICD-10-CM | POA: Insufficient documentation

## 2014-08-27 DIAGNOSIS — R000 Tachycardia, unspecified: Secondary | ICD-10-CM | POA: Insufficient documentation

## 2014-08-27 DIAGNOSIS — Z91040 Latex allergy status: Secondary | ICD-10-CM | POA: Insufficient documentation

## 2014-08-27 DIAGNOSIS — Z3202 Encounter for pregnancy test, result negative: Secondary | ICD-10-CM | POA: Insufficient documentation

## 2014-08-27 DIAGNOSIS — Z87440 Personal history of urinary (tract) infections: Secondary | ICD-10-CM | POA: Insufficient documentation

## 2014-08-27 DIAGNOSIS — Z9049 Acquired absence of other specified parts of digestive tract: Secondary | ICD-10-CM | POA: Insufficient documentation

## 2014-08-27 DIAGNOSIS — Z791 Long term (current) use of non-steroidal anti-inflammatories (NSAID): Secondary | ICD-10-CM | POA: Insufficient documentation

## 2014-08-27 DIAGNOSIS — Z9889 Other specified postprocedural states: Secondary | ICD-10-CM | POA: Insufficient documentation

## 2014-08-27 DIAGNOSIS — J020 Streptococcal pharyngitis: Secondary | ICD-10-CM

## 2014-08-27 DIAGNOSIS — Z8739 Personal history of other diseases of the musculoskeletal system and connective tissue: Secondary | ICD-10-CM | POA: Insufficient documentation

## 2014-08-27 DIAGNOSIS — J069 Acute upper respiratory infection, unspecified: Secondary | ICD-10-CM | POA: Insufficient documentation

## 2014-08-27 DIAGNOSIS — R109 Unspecified abdominal pain: Secondary | ICD-10-CM

## 2014-08-27 DIAGNOSIS — Z792 Long term (current) use of antibiotics: Secondary | ICD-10-CM | POA: Insufficient documentation

## 2014-08-27 LAB — BASIC METABOLIC PANEL
Anion gap: 6 (ref 5–15)
BUN: 9 mg/dL (ref 6–23)
CO2: 24 mmol/L (ref 19–32)
Calcium: 8.3 mg/dL — ABNORMAL LOW (ref 8.4–10.5)
Chloride: 108 mmol/L (ref 96–112)
Creatinine, Ser: 0.73 mg/dL (ref 0.50–1.10)
GLUCOSE: 97 mg/dL (ref 70–99)
Potassium: 3.7 mmol/L (ref 3.5–5.1)
SODIUM: 138 mmol/L (ref 135–145)

## 2014-08-27 LAB — CBC
HEMATOCRIT: 38.3 % (ref 36.0–46.0)
HEMOGLOBIN: 12.3 g/dL (ref 12.0–15.0)
MCH: 27.9 pg (ref 26.0–34.0)
MCHC: 32.1 g/dL (ref 30.0–36.0)
MCV: 86.8 fL (ref 78.0–100.0)
PLATELETS: 222 10*3/uL (ref 150–400)
RBC: 4.41 MIL/uL (ref 3.87–5.11)
RDW: 13.2 % (ref 11.5–15.5)
WBC: 15.3 10*3/uL — ABNORMAL HIGH (ref 4.0–10.5)

## 2014-08-27 LAB — URINALYSIS, ROUTINE W REFLEX MICROSCOPIC
Glucose, UA: NEGATIVE mg/dL
KETONES UR: 15 mg/dL — AB
NITRITE: NEGATIVE
PROTEIN: 30 mg/dL — AB
SPECIFIC GRAVITY, URINE: 1.026 (ref 1.005–1.030)
UROBILINOGEN UA: 1 mg/dL (ref 0.0–1.0)
pH: 5.5 (ref 5.0–8.0)

## 2014-08-27 LAB — URINE MICROSCOPIC-ADD ON

## 2014-08-27 LAB — RAPID STREP SCREEN (MED CTR MEBANE ONLY): Streptococcus, Group A Screen (Direct): POSITIVE — AB

## 2014-08-27 LAB — POC URINE PREG, ED: Preg Test, Ur: NEGATIVE

## 2014-08-27 MED ORDER — ONDANSETRON HCL 4 MG/2ML IJ SOLN
4.0000 mg | Freq: Once | INTRAMUSCULAR | Status: AC
  Administered 2014-08-27: 4 mg via INTRAVENOUS
  Filled 2014-08-27: qty 2

## 2014-08-27 MED ORDER — SULFAMETHOXAZOLE-TRIMETHOPRIM 800-160 MG PO TABS: 1.0000 | ORAL_TABLET | Freq: Two times a day (BID) | ORAL | Status: AC

## 2014-08-27 MED ORDER — SODIUM CHLORIDE 0.9 % IV BOLUS (SEPSIS)
1000.0000 mL | Freq: Once | INTRAVENOUS | Status: AC
  Administered 2014-08-27: 1000 mL via INTRAVENOUS

## 2014-08-27 MED ORDER — TRAMADOL HCL 50 MG PO TABS: 50.0000 mg | ORAL_TABLET | Freq: Four times a day (QID) | ORAL | Status: DC | PRN

## 2014-08-27 MED ORDER — KETOROLAC TROMETHAMINE 30 MG/ML IJ SOLN
30.0000 mg | Freq: Once | INTRAMUSCULAR | Status: AC
  Administered 2014-08-27: 30 mg via INTRAVENOUS
  Filled 2014-08-27: qty 1

## 2014-08-27 MED ORDER — PENICILLIN G BENZATHINE 1200000 UNIT/2ML IM SUSP
1.2000 10*6.[IU] | Freq: Once | INTRAMUSCULAR | Status: AC
  Administered 2014-08-27: 1.2 10*6.[IU] via INTRAMUSCULAR
  Filled 2014-08-27: qty 2

## 2014-08-27 NOTE — Discharge Instructions (Signed)
Your treated today for strep throat. Take Bactrim twice daily for the bladder infection. Follow-up with your primary care physician within 2-3 days. Rest and stay well-hydrated.  Strep Throat Strep throat is an infection of the throat caused by a bacteria named Streptococcus pyogenes. Your health care provider may call the infection streptococcal "tonsillitis" or "pharyngitis" depending on whether there are signs of inflammation in the tonsils or back of the throat. Strep throat is most common in children aged 5-15 years during the cold months of the year, but it can occur in people of any age during any season. This infection is spread from person to person (contagious) through coughing, sneezing, or other close contact. SIGNS AND SYMPTOMS   Fever or chills.  Painful, swollen, red tonsils or throat.  Pain or difficulty when swallowing.  White or yellow spots on the tonsils or throat.  Swollen, tender lymph nodes or "glands" of the neck or under the jaw.  Red rash all over the body (rare). DIAGNOSIS  Many different infections can cause the same symptoms. A test must be done to confirm the diagnosis so the right treatment can be given. A "rapid strep test" can help your health care provider make the diagnosis in a few minutes. If this test is not available, a light swab of the infected area can be used for a throat culture test. If a throat culture test is done, results are usually available in a day or two. TREATMENT  Strep throat is treated with antibiotic medicine. HOME CARE INSTRUCTIONS   Gargle with 1 tsp of salt in 1 cup of warm water, 3-4 times per day or as needed for comfort.  Family members who also have a sore throat or fever should be tested for strep throat and treated with antibiotics if they have the strep infection.  Make sure everyone in your household washes their hands well.  Do not share food, drinking cups, or personal items that could cause the infection to spread to  others.  You may need to eat a soft food diet until your sore throat gets better.  Drink enough water and fluids to keep your urine clear or pale yellow. This will help prevent dehydration.  Get plenty of rest.  Stay home from school, day care, or work until you have been on antibiotics for 24 hours.  Take medicines only as directed by your health care provider.  Take your antibiotic medicine as directed by your health care provider. Finish it even if you start to feel better. SEEK MEDICAL CARE IF:   The glands in your neck continue to enlarge.  You develop a rash, cough, or earache.  You cough up green, yellow-brown, or bloody sputum.  You have pain or discomfort not controlled by medicines.  Your problems seem to be getting worse rather than better.  You have a fever. SEEK IMMEDIATE MEDICAL CARE IF:   You develop any new symptoms such as vomiting, severe headache, stiff or painful neck, chest pain, shortness of breath, or trouble swallowing.  You develop severe throat pain, drooling, or changes in your voice.  You develop swelling of the neck, or the skin on the neck becomes red and tender.  You develop signs of dehydration, such as fatigue, dry mouth, and decreased urination.  You become increasingly sleepy, or you cannot wake up completely. MAKE SURE YOU:  Understand these instructions.  Will watch your condition.  Will get help right away if you are not doing well or get  worse. Document Released: 04/16/2000 Document Revised: 09/03/2013 Document Reviewed: 06/18/2010 [facility] Patient Information 2015 [place], [place]. This information is not intended to replace advice given to you by your health care provider. Make sure you discuss any questions you have with your health care provider. Urinary Tract Infection Urinary tract infections (UTIs) can develop anywhere along your urinary tract. Your urinary tract is your body's drainage system for removing wastes and extra  water. Your urinary tract includes two kidneys, two ureters, a bladder, and a urethra. Your kidneys are a pair of bean-shaped organs. Each kidney is about the size of your fist. They are located below your ribs, one on each side of your spine. CAUSES Infections are caused by microbes, which are microscopic organisms, including fungi, viruses, and bacteria. These organisms are so small that they can only be seen through a microscope. Bacteria are the microbes that most commonly cause UTIs. SYMPTOMS  Symptoms of UTIs may vary by age and gender of the patient and by the location of the infection. Symptoms in young women typically include a frequent and intense urge to urinate and a painful, burning feeling in the bladder or urethra during urination. Older women and men are more likely to be tired, shaky, and weak and have muscle aches and abdominal pain. A fever may mean the infection is in your kidneys. Other symptoms of a kidney infection include pain in your back or sides below the ribs, nausea, and vomiting. DIAGNOSIS To diagnose a UTI, your caregiver will ask you about your symptoms. Your caregiver also will ask to provide a urine sample. The urine sample will be tested for bacteria and white blood cells. White blood cells are made by your body to help fight infection. TREATMENT  Typically, UTIs can be treated with medication. Because most UTIs are caused by a bacterial infection, they usually can be treated with the use of antibiotics. The choice of antibiotic and length of treatment depend on your symptoms and the type of bacteria causing your infection. HOME CARE INSTRUCTIONS  If you were prescribed antibiotics, take them exactly as your caregiver instructs you. Finish the medication even if you feel better after you have only taken some of the medication.  Drink enough water and fluids to keep your urine clear or pale yellow.  Avoid caffeine, tea, and carbonated beverages. They tend to irritate  your bladder.  Empty your bladder often. Avoid holding urine for long periods of time.  Empty your bladder before and after sexual intercourse.  After a bowel movement, women should cleanse from front to back. Use each tissue only once. SEEK MEDICAL CARE IF:   You have back pain.  You develop a fever.  Your symptoms do not begin to resolve within 3 days. SEEK IMMEDIATE MEDICAL CARE IF:   You have severe back pain or lower abdominal pain.  You develop chills.  You have nausea or vomiting.  You have continued burning or discomfort with urination. MAKE SURE YOU:   Understand these instructions.  Will watch your condition.  Will get help right away if you are not doing well or get worse. Document Released: 01/27/2005 Document Revised: 10/19/2011 Document Reviewed: 05/28/2011 [facility] Patient Information 2015 [place], [place]. This information is not intended to replace advice given to you by your health care provider. Make sure you discuss any questions you have with your health care provider.

## 2014-08-27 NOTE — ED Notes (Signed)
Pt reports sore throat, productive cough , l/lower back pain , chills., generalized body aches. Denies fever. Treated with OTC , Tylenol cough and cold. Pt reports that it is painful to swallow.Throat red, no pustules noted

## 2014-08-27 NOTE — ED Provider Notes (Signed)
CSN: [id number]     Arrival date & time 08/27/14  1106 History   First MD Initiated Contact with Patient 08/27/14 1112     Chief Complaint  Patient presents with  . Cough    Sx x 24 hours  . Back Pain  . Sore Throat  . Chills     (Consider location/radiation/quality/duration/timing/severity/associated sxs/prior Treatment) HPI Comments: 34 year old female presenting with multiple complaints. First, she is complaining of left-sided low back pain, radiating around to her left flank with associated dysuria x 2 days. Pain is constant, worse with movement. She is also complaining of a sore throat, making it difficult to swallow. Sore throat worse with coughing. Reports she is coughing up green mucus. Admits to associated fever, temperature of 100 yesterday evening when she took over-the-counter Tylenol flu medication with some relief. She endorses chills. Admits to nausea without vomiting. No sick contacts. Currently on her menstrual cycle. Reports no appetite and she is starting to feel fatigued, lightheaded and dizzy.  Patient is a 34 y.o. female presenting with cough, back pain, and pharyngitis. The history is provided by the patient.  Cough Associated symptoms: chills, fever and sore throat   Back Pain Associated symptoms: dysuria and fever   Sore Throat Associated symptoms include chills, coughing, fatigue, a fever, nausea and a sore throat.    Past Medical History  Diagnosis Date  . GERD (gastroesophageal reflux disease)   . Indigestion   . Ectopic pregnancy   . Headache(784.0)   . Infection     UTI  . Fibromyalgia     'had it before' no problems in 5-15yrs  . Pregnancy induced hypertension     with first   Past Surgical History  Procedure Laterality Date  . Cesarean section    . Therapeutic abortion    . Cardiac catheterization      not a cardiac problem- really bad acid reflex  . Cholecystectomy N/A 10/11/2013    Procedure: LAPAROSCOPIC CHOLECYSTECTOMY;  Surgeon: Shelly Rubenstein, MD;  Location: [facility];  Service: General;  Laterality: N/A;  . Cardiac catheterization  2006   Family History  Problem Relation Age of Onset  . Diabetes Maternal Aunt   . Cancer Maternal Aunt     mother's aunt Br Ca  . Diabetes Maternal Grandmother   . Hypertension Maternal Grandmother   . Heart disease Maternal Grandmother   . Other Neg Hx    History  Substance Use Topics  . Smoking status: Former Games developer  . Smokeless tobacco: Never Used     Comment: quit 2009  . Alcohol Use: No   OB History    Gravida Para Term Preterm AB TAB SAB Ectopic Multiple Living   Review of Systems  Constitutional: Positive for fever, chills, appetite change and fatigue.  HENT: Positive for sore throat.   Respiratory: Positive for cough.   Gastrointestinal: Positive for nausea.  Genitourinary: Positive for dysuria and flank pain.  Musculoskeletal: Positive for back pain.  Neurological: Positive for dizziness.  All other systems reviewed and are negative.     Allergies  Latex and Tomato  Home Medications   Prior to Admission medications   Medication Sig Start Date End Date Taking? Authorizing Provider  acetaminophen (TYLENOL) 325 MG tablet Take 650 mg by mouth every 6 (six) hours as needed for moderate pain.    Historical Provider, MD  azithromycin (ZITHROMAX) 250 MG tablet Take 1  tablet (250 mg total) by mouth daily. Take first 2 tablets together, then 1 every day until finished. Patient not taking: Reported on 07/03/2014 05/17/14   Rodolph Bong, MD  cephALEXin (KEFLEX) 500 MG capsule Take 1 capsule (500 mg total) by mouth 4 (four) times daily. 07/03/14   Hannah Muthersbaugh, PA-C  ferrous sulfate 325 (65 FE) MG EC tablet Take 1 tablet (325 mg total) by mouth 2 (two) times daily with a meal. Patient not taking: Reported on 07/03/2014 12/29/13   Marlow Baars, MD  ibuprofen (ADVIL,MOTRIN) 600 MG tablet Take 1 tablet (600 mg total) by mouth every 6 (six) hours as  needed (pain scale < 4). 12/29/13   Marlow Baars, MD  meloxicam (MOBIC) 15 MG tablet Take 1 tablet (15 mg total) by mouth daily. 07/03/14   Hannah Muthersbaugh, PA-C  methocarbamol (ROBAXIN) 500 MG tablet Take 1 tablet (500 mg total) by mouth 2 (two) times daily. 07/03/14   Hannah Muthersbaugh, PA-C  predniSONE (DELTASONE) 10 MG tablet 4 tabs po daily x 3 days, then 3 tabs x 3 days, then 2 tabs x 3 days, then 1 tab x 3 days, then 0.5 tabs x 4 days 07/03/14   Dahlia Client Muthersbaugh, PA-C  sulfamethoxazole-trimethoprim (BACTRIM DS,SEPTRA DS) 800-160 MG per tablet Take 1 tablet by mouth 2 (two) times daily. 08/27/14 09/03/14  Nada Boozer Fawzi Melman, PA-C  traMADol (ULTRAM) 50 MG tablet Take 1 tablet (50 mg total) by mouth every 6 (six) hours as needed. 08/27/14   Avari Nevares M Reha Martinovich, PA-C   BP 98/60 mmHg  Pulse 82  Temp(Src) 98.3 F (36.8 C) (Oral)  Resp 18  Wt 200 lb (90.719 kg)  SpO2 96%  LMP 08/26/2014 (Exact Date)  Breastfeeding? No Physical Exam  Constitutional: She is oriented to person, place, and time. She appears well-developed and well-nourished. No distress.  HENT:  Head: Normocephalic and atraumatic.  Tonsils enlarged and inflamed bilateral, +3 on the right, +2 on the left with bilateral exudate. Uvula midline. No tonsillar abscess.  Eyes: Conjunctivae and EOM are normal.  Neck: Normal range of motion. Neck supple.  Cardiovascular: Regular rhythm and normal heart sounds.   Mild tachycardia.  Pulmonary/Chest: Effort normal and breath sounds normal. No respiratory distress.  Abdominal: Soft. Bowel sounds are normal. She exhibits no distension.  L CVAT. Mild suprapubic tenderness. No peritoneal signs.  Musculoskeletal: Normal range of motion. She exhibits no edema.  Lymphadenopathy:    She has cervical adenopathy.  Neurological: She is alert and oriented to person, place, and time. No sensory deficit.  Skin: Skin is warm and dry.  Psychiatric: She has a normal mood and affect. Her behavior is normal.   Nursing note and vitals reviewed.   ED Course  Procedures (including critical care time) Labs Review Labs Reviewed  RAPID STREP SCREEN - Abnormal; Notable for the following:    Streptococcus, Group A Screen (Direct) POSITIVE (*)    All other components within normal limits  URINALYSIS, ROUTINE W REFLEX MICROSCOPIC - Abnormal; Notable for the following:    Color, Urine RED (*)    APPearance TURBID (*)    Hgb urine dipstick LARGE (*)    Bilirubin Urine MODERATE (*)    Ketones, ur 15 (*)    Protein, ur 30 (*)    Leukocytes, UA MODERATE (*)    All other components within normal limits  CBC - Abnormal; Notable for the following:    WBC 15.3 (*)    All other components within normal limits  BASIC METABOLIC PANEL - Abnormal; Notable for the following:    Calcium 8.3 (*)    All other components within normal limits  URINE MICROSCOPIC-ADD ON  POC URINE PREG, ED    Imaging Review Dg Chest 2 View  08/27/2014   CLINICAL DATA:  Sore throat and productive cough. Low back pain. Chills. Fever and body aches.  EXAM: CHEST - 2 VIEW  COMPARISON:  None.  FINDINGS: The heart size and mediastinal contours are within normal limits. Both lungs are clear. The visualized skeletal structures are unremarkable.  IMPRESSION: Negative two view chest x-ray.   Electronically Signed   By: Marin Robertshristopher  Mattern M.D.   On: 08/27/2014 13:43   Ct Renal Stone Study  08/27/2014   CLINICAL DATA:  34 year old female with hematuria upon urination. Worse at end of urination. Left-sided pain. Evaluate for kidney stones. Initial encounter.  EXAM: CT ABDOMEN AND PELVIS WITHOUT CONTRAST  TECHNIQUE: Multidetector CT imaging of the abdomen and pelvis was performed following the standard protocol without IV contrast.  COMPARISON:  03/30/2008.  FINDINGS: No renal or ureteral obstructing stone noted.  Decompressed urinary bladder with slight and bladder wall thickening without obvious mass however, evaluation is limited without  distension and contrast.  Retroflexed prominent sized uterus.  No adnexal mass noted.  Scattered colonic diverticula. No extra luminal bowel inflammatory process, free fluid or free air. Diastases of the rectus muscles through which bowel partially traverses but does not become obstructed.  Post cholecystectomy. 8 mm hypodensity right lobe liver too small to characterize. Taking into account limitation by non contrast imaging, no worrisome hepatic, splenic, pancreatic, renal or adrenal lesion.  No abdominal aortic aneurysm.  No adenopathy.  Atypical appearance of the ischium bilaterally may represent congenital abnormality or result of prior fracture.  Lung bases are clear.  IMPRESSION: No renal or ureteral obstructing stone noted.  Decompressed urinary bladder with slight and bladder wall thickening without obvious mass however, evaluation is limited without distension and contrast.  Retroflexed prominent sized uterus.  Scattered colonic diverticular. No extra luminal bowel inflammatory process, free fluid or free air. Diastases of the rectus muscles through which bowel partially traverses but does not become obstructed.  Post cholecystectomy.  8 mm hypodensity right lobe liver too small to characterize.  Atypical appearance of the ischium bilaterally may represent congenital abnormality or result of prior fracture.   Electronically Signed   By: Lacy DuverneySteven  Olson M.D.   On: 08/27/2014 13:13     EKG Interpretation None      MDM   Final diagnoses:  Strep throat  Cystitis  URI (upper respiratory infection)   Nontoxic appearing, NAD. Afebrile. Mildly tachycardic on arrival. Multiple complaints as stated above. UA with large amount of blood, patient is on her menstrual cycle, moderate leukocytes. Given left CVA tenderness, dysuria, CT without contrast obtained to rule out stone. CT results as stated above. Will treat cystitis with Keflex. Rapid strep positive. No s/s tonsillar abscess. Chest x-ray negative.  Patient feeling better after IV fluids, Toradol and Zofran. Stable for discharge. Follow-up with PCP. Return precautions given. Patient states understanding of treatment care plan and is agreeable.  Kathrynn SpeedRobyn M Cartier Mapel, PA-C 08/28/14 1316  Shon Batonourtney F Horton, MD 08/28/14 1447

## 2014-08-27 NOTE — ED Notes (Signed)
Antibiotic delayed x2. Pt out of room , in radiology

## 2015-01-15 ENCOUNTER — Emergency Department (INDEPENDENT_AMBULATORY_CARE_PROVIDER_SITE_OTHER)
Admission: EM | Admit: 2015-01-15 | Discharge: 2015-01-15 | Disposition: A | Discharge status: Home / Self Care | Payer: Self-pay | Source: Home / Self Care | Attending: Family Medicine | Admitting: Family Medicine

## 2015-01-15 ENCOUNTER — Encounter (HOSPITAL_COMMUNITY): Payer: Self-pay | Admitting: Family Medicine

## 2015-01-15 DIAGNOSIS — M62838 Other muscle spasm: Secondary | ICD-10-CM

## 2015-01-15 MED ORDER — METHOCARBAMOL 500 MG PO TABS: 500.0000 mg | ORAL_TABLET | Freq: Four times a day (QID) | ORAL | Status: DC | PRN

## 2015-01-15 MED ORDER — DICLOFENAC SODIUM 75 MG PO TBEC: 75.0000 mg | DELAYED_RELEASE_TABLET | Freq: Two times a day (BID) | ORAL | Status: DC

## 2015-01-15 NOTE — Discharge Instructions (Signed)
There is no evidence of permanent injury. You may be more sore over the next 1-2 days but should then make a speedy recovery. Please use the voltaren for pain and inflammation and the robaxin for muscle spasm. Please continue using a heating pad and stay active. Let pain be your guide.
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
## 2015-04-04 ENCOUNTER — Encounter (HOSPITAL_COMMUNITY): Payer: Self-pay | Admitting: Emergency Medicine

## 2015-04-04 ENCOUNTER — Emergency Department (HOSPITAL_COMMUNITY): Payer: Self-pay

## 2015-04-04 ENCOUNTER — Emergency Department (HOSPITAL_COMMUNITY)
Admission: EM | Admit: 2015-04-04 | Discharge: 2015-04-04 | Disposition: A | Discharge status: Home / Self Care | Payer: Self-pay | Attending: Emergency Medicine | Admitting: Emergency Medicine

## 2015-04-04 ENCOUNTER — Emergency Department (HOSPITAL_COMMUNITY): Payer: Medicaid Other

## 2015-04-04 DIAGNOSIS — M797 Fibromyalgia: Secondary | ICD-10-CM | POA: Insufficient documentation

## 2015-04-04 DIAGNOSIS — Z792 Long term (current) use of antibiotics: Secondary | ICD-10-CM | POA: Insufficient documentation

## 2015-04-04 DIAGNOSIS — J039 Acute tonsillitis, unspecified: Secondary | ICD-10-CM | POA: Insufficient documentation

## 2015-04-04 DIAGNOSIS — Z8719 Personal history of other diseases of the digestive system: Secondary | ICD-10-CM | POA: Insufficient documentation

## 2015-04-04 DIAGNOSIS — Z87891 Personal history of nicotine dependence: Secondary | ICD-10-CM | POA: Insufficient documentation

## 2015-04-04 DIAGNOSIS — Z87440 Personal history of urinary (tract) infections: Secondary | ICD-10-CM | POA: Insufficient documentation

## 2015-04-04 DIAGNOSIS — Z7952 Long term (current) use of systemic steroids: Secondary | ICD-10-CM | POA: Insufficient documentation

## 2015-04-04 DIAGNOSIS — Z91040 Latex allergy status: Secondary | ICD-10-CM | POA: Insufficient documentation

## 2015-04-04 LAB — BASIC METABOLIC PANEL
Anion gap: 6 (ref 5–15)
BUN: 9 mg/dL (ref 6–20)
CALCIUM: 8.7 mg/dL — AB (ref 8.9–10.3)
CO2: 28 mmol/L (ref 22–32)
Chloride: 105 mmol/L (ref 101–111)
Creatinine, Ser: 0.73 mg/dL (ref 0.44–1.00)
GFR calc Af Amer: >60 mL/min (ref >60)
Glucose, Bld: 104 mg/dL — ABNORMAL HIGH (ref 65–99)
POTASSIUM: 3.9 mmol/L (ref 3.5–5.1)
SODIUM: 139 mmol/L (ref 135–145)

## 2015-04-04 LAB — CBC WITH DIFFERENTIAL/PLATELET
BASOS ABS: 0 10*3/uL (ref 0.0–0.1)
Basophils Relative: 0 %
EOS PCT: 0 %
Eosinophils Absolute: 0 10*3/uL (ref 0.0–0.7)
HCT: 37.6 % (ref 36.0–46.0)
Hemoglobin: 12.2 g/dL (ref 12.0–15.0)
Lymphocytes Relative: 23 %
Lymphs Abs: 2.1 10*3/uL (ref 0.7–4.0)
MCH: 28.2 pg (ref 26.0–34.0)
MCHC: 32.4 g/dL (ref 30.0–36.0)
MCV: 87 fL (ref 78.0–100.0)
Monocytes Absolute: 0.8 10*3/uL (ref 0.1–1.0)
Monocytes Relative: 9 %
Neutro Abs: 6.4 10*3/uL (ref 1.7–7.7)
Neutrophils Relative %: 68 %
PLATELETS: 233 10*3/uL (ref 150–400)
RBC: 4.32 MIL/uL (ref 3.87–5.11)
RDW: 12.9 % (ref 11.5–15.5)
WBC: 9.4 10*3/uL (ref 4.0–10.5)

## 2015-04-04 LAB — RAPID STREP SCREEN (MED CTR MEBANE ONLY): STREPTOCOCCUS, GROUP A SCREEN (DIRECT): NEGATIVE

## 2015-04-04 MED ORDER — IOHEXOL 300 MG/ML  SOLN
75.0000 mL | Freq: Once | INTRAMUSCULAR | Status: AC | PRN
  Administered 2015-04-04: 75 mL via INTRAVENOUS

## 2015-04-04 MED ORDER — IBUPROFEN 600 MG PO TABS
600.0000 mg | ORAL_TABLET | Freq: Four times a day (QID) | ORAL | Status: DC | PRN
Start: 2015-04-04 — End: 2015-05-25

## 2015-04-04 MED ORDER — MAGIC MOUTHWASH
5.0000 mL | Freq: Once | ORAL | Status: AC
  Administered 2015-04-04: 5 mL via ORAL
  Filled 2015-04-04: qty 5

## 2015-04-04 MED ORDER — IBUPROFEN 800 MG PO TABS
800.0000 mg | ORAL_TABLET | Freq: Once | ORAL | Status: AC
  Administered 2015-04-04: 800 mg via ORAL
  Filled 2015-04-04: qty 1

## 2015-04-04 MED ORDER — PREDNISONE 20 MG PO TABS: 40.0000 mg | ORAL_TABLET | Freq: Every day | ORAL | Status: DC

## 2015-04-04 NOTE — Discharge Instructions (Signed)
1. Medications: prednisone, ibuprofen, usual home medications 2. Treatment: rest, drink plenty of fluids, try warm honey, tea, throat lozenges for additional symptom relief 3. Follow Up: please followup with your primary doctor Monday for discussion of your diagnoses and further evaluation after today's visit; if you do not have a primary care doctor use the resource guide provided to find one; please return to the ER for high fever, difficulty swallowing or handling secretions, change in voice, shortness of breath, new or worsening symptoms   Tonsillitis Tonsillitis is an infection of the throat that causes the tonsils to become red, tender, and swollen. Tonsils are collections of lymphoid tissue at the back of the throat. Each tonsil has crevices (crypts). Tonsils help fight nose and throat infections and keep infection from spreading to other parts of the body for the first 18 months of life.  CAUSES Sudden (acute) tonsillitis is usually caused by infection with streptococcal bacteria. Long-lasting (chronic) tonsillitis occurs when the crypts of the tonsils become filled with pieces of food and bacteria, which makes it easy for the tonsils to become repeatedly infected. SYMPTOMS  Symptoms of tonsillitis include:  A sore throat, with possible difficulty swallowing.  White patches on the tonsils.  Fever.  Tiredness.  New episodes of snoring during sleep, when you did not snore before.  Small, foul-smelling, yellowish-white pieces of material (tonsilloliths) that you occasionally cough up or spit out. The tonsilloliths can also cause you to have bad breath. DIAGNOSIS Tonsillitis can be diagnosed through a physical exam. Diagnosis can be confirmed with the results of lab tests, including a throat culture. TREATMENT  The goals of tonsillitis treatment include the reduction of the severity and duration of symptoms and prevention of associated conditions. Symptoms of tonsillitis can be improved  with the use of steroids to reduce the swelling. Tonsillitis caused by bacteria can be treated with antibiotic medicines. Usually, treatment with antibiotic medicines is started before the cause of the tonsillitis is known. However, if it is determined that the cause is not bacterial, antibiotic medicines will not treat the tonsillitis. If attacks of tonsillitis are severe and frequent, your health care provider may recommend surgery to remove the tonsils (tonsillectomy). HOME CARE INSTRUCTIONS   Rest as much as possible and get plenty of sleep.  Drink plenty of fluids. While the throat is very sore, eat soft foods or liquids, such as sherbet, soups, or instant breakfast drinks.  Eat frozen ice pops.  Gargle with a warm or cold liquid to help soothe the throat. Mix 1/4 teaspoon of salt and 1/4 teaspoon of baking soda in 8 oz of water. SEEK MEDICAL CARE IF:   Large, tender lumps develop in your neck.  A rash develops.  A green, yellow-brown, or bloody substance is coughed up.  You are unable to swallow liquids or food for 24 hours.  You notice that only one of the tonsils is swollen. SEEK IMMEDIATE MEDICAL CARE IF:   You develop any new symptoms such as vomiting, severe headache, stiff neck, chest pain, or trouble breathing or swallowing.  You have severe throat pain along with drooling or voice changes.  You have severe pain, unrelieved with recommended medications.  You are unable to fully open the mouth.  You develop redness, swelling, or severe pain anywhere in the neck.  You have a fever. MAKE SURE YOU:   Understand these instructions.  Will watch your condition.  Will get help right away if you are not doing well or get worse.  This information is not intended to replace advice given to you by your health care provider. Make sure you discuss any questions you have with your health care provider.   Document Released: 01/27/2005 Document Revised: 05/10/2014 Document  Reviewed: 10/06/2012 Elsevier Interactive Patient Education [employer]

## 2015-04-04 NOTE — ED Notes (Signed)
Pt reports sore throat for the past 4 days. Able to drink fluids, but it does hurt. More painful on R side. Tonsils swollen bilaterally.

## 2015-04-04 NOTE — Progress Notes (Signed)
1:30pm- Pt taken for CAT scan. 1:45p-Pt came back from CAT scan and tolerated well.

## 2015-04-04 NOTE — ED Notes (Signed)
Pt adds that she has been having a congested non-productive cough and has body aches.

## 2015-04-04 NOTE — ED Provider Notes (Signed)
CSN: [id number]     Arrival date & time 04/04/15  1118 History   First MD Initiated Contact with Patient 04/04/15 1135     Chief Complaint  Patient presents with  . Sore Throat    HPI   [name] is a 34 y.o. female with a PMH of GERD, headaches, fibromyalgia who presents to the ED with sore throat, which she states started Wednesday. She reports her pain is worse on the right. She states swallowing exacerbates her pain, though reports she is able to swallow. She has tried gargling salt water for symptom relief, which has been minimally effective. She denies fever, chills, nasal congestion, difficulty handling her secretions. She reports nonproductive cough.   Past Medical History  Diagnosis Date  . GERD (gastroesophageal reflux disease)   . Indigestion   . Ectopic pregnancy   . Headache(784.0)   . Infection     UTI  . Fibromyalgia     'had it before' no problems in 5-15yrs  . Pregnancy induced hypertension     with first   Past Surgical History  Procedure Laterality Date  . Cesarean section    . Therapeutic abortion    . Cardiac catheterization      not a cardiac problem- really bad acid reflex  . Cholecystectomy N/A 10/11/2013    Procedure: LAPAROSCOPIC CHOLECYSTECTOMY;  Surgeon: Shelly Rubenstein, MD;  Location: [facility];  Service: General;  Laterality: N/A;  . Cardiac catheterization  2006   Family History  Problem Relation Age of Onset  . Diabetes Maternal Aunt   . Cancer Maternal Aunt     mother's aunt Br Ca  . Diabetes Maternal Grandmother   . Hypertension Maternal Grandmother   . Heart disease Maternal Grandmother   . Other Neg Hx    Social History  Substance Use Topics  . Smoking status: Former Games developer  . Smokeless tobacco: Never Used     Comment: quit 2009  . Alcohol Use: No   OB History    Gravida Para Term Preterm AB TAB SAB Ectopic Multiple Living   Review of Systems  Constitutional: Negative for fever and chills.   HENT: Positive for sore throat. Negative for congestion and trouble swallowing.   Respiratory: Positive for cough.   All other systems reviewed and are negative.     Allergies  Latex and Tomato  Home Medications   Prior to Admission medications   Medication Sig Start Date End Date Taking? Authorizing Provider  acetaminophen (TYLENOL) 325 MG tablet Take 650 mg by mouth every 6 (six) hours as needed for moderate pain.   Yes Historical Provider, MD  cetirizine (ZYRTEC) 10 MG tablet Take 10 mg by mouth daily as needed for allergies.   Yes Historical Provider, MD  penicillin v potassium (VEETID) 500 MG tablet Take 500 mg by mouth 2 (two) times daily.   Yes Historical Provider, MD  diclofenac (VOLTAREN) 75 MG EC tablet Take 1 tablet (75 mg total) by mouth 2 (two) times daily. Patient not taking: Reported on 04/04/2015 01/15/15   Ozella Rocks, MD  ibuprofen (ADVIL,MOTRIN) 600 MG tablet Take 1 tablet (600 mg total) by mouth every 6 (six) hours as needed. 04/04/15   Mady Gemma, PA-C  methocarbamol (ROBAXIN) 500 MG tablet Take 1-2 tablets (500-1,000 mg total) by mouth every 6 (six) hours as needed for muscle spasms. Patient not taking: Reported on 04/04/2015  01/15/15   Ozella Rocksavid J Merrell, MD  predniSONE (DELTASONE) 20 MG tablet Take 2 tablets (40 mg total) by mouth daily. 04/04/15   Mady GemmaElizabeth C Westfall, PA-C    BP 101/70 mmHg  Pulse 73  Temp(Src) 97.8 F (36.6 C) (Oral)  Resp 20  SpO2 99%  LMP 04/04/2015 Physical Exam  Constitutional: She is oriented to person, place, and time. She appears well-developed and well-nourished. No distress.  HENT:  Head: Normocephalic and atraumatic.  Right Ear: External ear normal.  Left Ear: External ear normal.  Nose: Nose normal.  Mouth/Throat: Mucous membranes are normal. Posterior oropharyngeal edema and posterior oropharyngeal erythema present. No oropharyngeal exudate.  Bilateral tonsillar hypertrophy (R>L) with associated erythema.   Eyes:  Conjunctivae and EOM are normal. Pupils are equal, round, and reactive to light. Right eye exhibits no discharge. Left eye exhibits no discharge. No scleral icterus.  Neck: Normal range of motion. Neck supple.  Cardiovascular: Normal rate, regular rhythm, normal heart sounds and intact distal pulses.   Pulmonary/Chest: Effort normal and breath sounds normal. No respiratory distress. She has no wheezes. She has no rales.  Musculoskeletal: Normal range of motion. She exhibits no edema or tenderness.  Neurological: She is alert and oriented to person, place, and time.  Skin: Skin is warm and dry. She is not diaphoretic.  Psychiatric: She has a normal mood and affect. Her behavior is normal.  Nursing note and vitals reviewed.   ED Course  Procedures (including critical care time)  Labs Review Labs Reviewed  BASIC METABOLIC PANEL - Abnormal; Notable for the following:    Glucose, Bld 104 (*)    Calcium 8.7 (*)    All other components within normal limits  RAPID STREP SCREEN (NOT AT [facility])  CULTURE, GROUP A STREP  CBC WITH DIFFERENTIAL/PLATELET    Imaging Review Ct Soft Tissue Neck W Contrast  04/04/2015  CLINICAL DATA:  Sore throat for 4 days. Tonsillar enlargement right more than left. EXAM: CT NECK WITH CONTRAST TECHNIQUE: Multidetector CT imaging of the neck was performed using the standard protocol following the bolus administration of intravenous contrast. CONTRAST:  75mL OMNIPAQUE IOHEXOL 300 MG/ML  SOLN COMPARISON:  None. FINDINGS: Pharynx and larynx: There is enlargement of tonsillar tissue, greatest at the right palate teen tonsil. Given history, this is most consistent with tonsillitis. There is some low-attenuation in the right peritonsillar region but no well-defined abscess. No epiglottitis and no laryngeal edema. No retro pharyngeal edema or abscess. Salivary glands: Negative Thyroid: Negative Lymph nodes: Mild reactive appearing enlargement of upper cervical nodes Vascular: Major  vessels are patent Limited intracranial: Negative Visualized orbits: Negative Mastoids and visualized paranasal sinuses: Mucosal thickening focally in the inferior left maxillary antrum, potentially odontogenic. Skeleton: Negative Upper chest: No apical pneumonia. IMPRESSION: Tonsillitis and cervical adenitis without abscess. Electronically Signed   By: Marnee SpringJonathon  Watts M.D.   On: 04/04/2015 14:10     I have personally reviewed and evaluated these images and lab results as part of my medical decision-making.   EKG Interpretation None      MDM   Final diagnoses:  Tonsillitis    34 year old female presents with sore throat since Wednesday. Denies fever, chills, nasal congestion, difficulty swallowing or handling her secretions, shortness of breath. Reports associated nonproductive cough. Patient is afebrile. Vital signs stable. Posterior oropharynx with bilateral tonsillar hypertrophy and erythema, right greater than left. Patient handling secretions well. No muffled voice. Heart regular rate and rhythm. Lungs clear to auscultation bilaterally.  Will obtain  rapid strep, basic labs, and CT soft tissue neck given swelling on exam. Patient given motrin for symptoms.  CBC negative for leukocytosis or anemia. BMP unremarkable. Rapid strep negative. Imaging remarkable for tonsillitis and cervical adenitis without abscess.  Of note, patient states she has been taking antibiotics for dental extraction; she states she has taken 7 days of penicillin and has 3 days remaining. Advised patient to continue antibiotic course. Will treat with short course of steroids and give motrin for pain and inflammation. Return precautions discussed. Patient to follow up with PCP in 2-3 days for re-check, advised to go to [facility] and [facility]. Patient verbalizes her understanding and is in agreement with plan.    BP 101/70 mmHg  Pulse 73  Temp(Src) 97.8 F (36.6 C) (Oral)  Resp 20  SpO2 99%  LMP  04/04/2015    Mady Gemma, PA-C 04/04/15 1449  Arby Barrette, MD 04/06/15 [phone]

## 2015-04-06 LAB — CULTURE, GROUP A STREP: STREP A CULTURE: NEGATIVE

## 2015-05-25 ENCOUNTER — Encounter (HOSPITAL_COMMUNITY): Payer: Self-pay | Admitting: Emergency Medicine

## 2015-05-25 ENCOUNTER — Emergency Department (HOSPITAL_COMMUNITY): Payer: Medicaid Other

## 2015-05-25 ENCOUNTER — Emergency Department (HOSPITAL_COMMUNITY)
Admission: EM | Admit: 2015-05-25 | Discharge: 2015-05-25 | Disposition: A | Discharge status: Home / Self Care | Payer: Medicaid Other | Attending: Emergency Medicine | Admitting: Emergency Medicine

## 2015-05-25 DIAGNOSIS — Z9889 Other specified postprocedural states: Secondary | ICD-10-CM | POA: Insufficient documentation

## 2015-05-25 DIAGNOSIS — Z8719 Personal history of other diseases of the digestive system: Secondary | ICD-10-CM | POA: Insufficient documentation

## 2015-05-25 DIAGNOSIS — Z87440 Personal history of urinary (tract) infections: Secondary | ICD-10-CM | POA: Insufficient documentation

## 2015-05-25 DIAGNOSIS — Z91040 Latex allergy status: Secondary | ICD-10-CM | POA: Insufficient documentation

## 2015-05-25 DIAGNOSIS — M25511 Pain in right shoulder: Secondary | ICD-10-CM | POA: Insufficient documentation

## 2015-05-25 DIAGNOSIS — R079 Chest pain, unspecified: Secondary | ICD-10-CM | POA: Insufficient documentation

## 2015-05-25 DIAGNOSIS — M25512 Pain in left shoulder: Secondary | ICD-10-CM | POA: Insufficient documentation

## 2015-05-25 DIAGNOSIS — M542 Cervicalgia: Secondary | ICD-10-CM | POA: Insufficient documentation

## 2015-05-25 MED ORDER — METHOCARBAMOL 500 MG PO TABS: 500.0000 mg | ORAL_TABLET | Freq: Two times a day (BID) | ORAL | Status: DC | PRN

## 2015-05-25 MED ORDER — IBUPROFEN 800 MG PO TABS: 800.0000 mg | ORAL_TABLET | Freq: Three times a day (TID) | ORAL | Status: DC

## 2015-05-25 NOTE — Discharge Instructions (Signed)
Please obtain all of your results from medical records or have your doctors office obtain the results - share them with your doctor - you should be seen at your doctors office in the next 2 days. Call today to arrange your follow up. Take the medications as prescribed. Please review all of the medicines and only take them if you do not have an allergy to them. Please be aware that if you are taking birth control pills, taking other prescriptions, ESPECIALLY ANTIBIOTICS may make the birth control ineffective - if this is the case, either do not engage in sexual activity or use alternative methods of birth control such as condoms until you have finished the medicine and your family doctor says it is OK to restart them. If you are on a blood thinner such as COUMADIN, be aware that any other medicine that you take may cause the coumadin to either work too much, or not enough - you should have your coumadin level rechecked in next 7 days if this is the case.  °?  °It is also a possibility that you have an allergic reaction to any of the medicines that you have been prescribed - Everybody reacts differently to medications and while MOST people have no trouble with most medicines, you may have a reaction such as nausea, vomiting, rash, swelling, shortness of breath. If this is the case, please stop taking the medicine immediately and contact your physician.  °?  °You should return to the ER if you develop severe or worsening symptoms.  ° ° °RESOURCE GUIDE ° °Chronic Pain Problems: °Contact [facility]  [phone] °Patients need to be referred by their primary care doctor. ° °Insufficient Money for Medicine: °Contact [employer]:  call "211."  ° °No Primary Care Doctor: °- Call [facility]  [phone] - can help you locate a primary care doctor that  accepts your insurance, provides certain services, etc. °- Physician Referral Service- 1-800-533-3463 ° °Agencies that provide inexpensive medical  care: °- [facility]  [phone] °- [facility]  [phone] °- [facility]  [phone] °- [facility]  [phone] °- [facility]  [phone] °- [facility]  [phone] ° °Medicaid-accepting [place] Providers: °- [facility]- [address] ° [phone], Mon-Fri 9am-7pm, Sat 9am-1pm °- [facility]- [address], [address] ° [phone] °- [facility]- [address], [address] ° [phone] °- [facility]- [address] [place] [address] ° [phone] °- [facility]- [address], [address], [phone] ° Only accepts [place] Access Medicaid patients after they have their name  applied to their card ° °Self Pay (no insurance) in [place]: °- Sickle Cell Patients: Dr Eric Dean, [facility] ° [address], [phone] °- [facility]- [address] ° [phone] °      -     [facility]- [address] °      -     [facility]- see information above (Speak to Pam H if you do not have insurance) °      -  [facility] [place]- [address],  [phone] °      -  [facility]- [address] [place] [address], [phone] °      -  Dr Osei-Bonsu-  [address],   [place], [phone] °      -  [facility] - [address], [phone] °      -  [facility]- [address] [place] [address], [phone], also [address] °  [address], [phone] °      -    [facility]- [address], [phone], 1st & 3rd Saturday °       every month, 10am-1pm ° °[facility] °[address] °[place], [place] [address] °[phone] ° °[facility] °[address] °[place], [place] [address] °([phone]) [phone] ° °1) Find a Doctor and Pay Out of Pocket °Although you won't have to find out who is covered by your insurance plan, it is a good idea  to ask around and get recommendations. You will then need to call the office and see if the doctor you have chosen will accept you as a new patient and what types of options they offer for patients who are self-pay. Some doctors offer discounts or will set up payment plans for their patients who do not have insurance, but you will need to ask so you aren't surprised when you get to your appointment. ° °2) Contact Your Local Health Department °Not all health departments have doctors that can see patients for sick visits, but many do, so it is worth a call to see if yours does. If you don't know where your local health department is, you can check in your phone book. The CDC also has a tool to help you locate your state's health department, and many state websites also have listings of all of their local health departments. ° °3) Find a Walk-in Clinic °If your illness is not likely to be very severe or complicated, you may want to try a walk in clinic. These are popping up all over the country in pharmacies, drugstores, and shopping centers. They're usually staffed by nurse practitioners or physician assistants that have been trained to treat common illnesses and complaints. They're usually fairly quick and inexpensive. However, if you have serious medical issues or chronic medical problems, these are probably not your best option ° °STD Testing °- [place] [facility] [place], [facility], [address], [place], phone [phone] or [phone].  Monday - Friday, call for an appointment. °- [place] [facility] [place], [facility], [address], [place], phone [phone] or [phone].  Monday - Friday, call for an appointment. ° °Abuse/Neglect: °- [place] Child Abuse Hotline (336) 641-3795 °- [place] Child Abuse Hotline 800-378-5315 (After Hours) ° °Emergency Shelter:  [facility] (336) 271-5985 ° °Maternity  Homes: °- [facility] (336) 275-9566 °- [facility] (704) 372-4663 ° °MRSA Hotline #:   832-7006 ° °Dental Assistance °If unable to pay or uninsured, contact:  [place] [facility] to become qualified for the adult dental clinic. ° °Patients with Medicaid: [facility] °[address], [phone] °[address], [phone] ° °If unable to pay, or uninsured, contact [place] [facility] ([phone] in [place], [phone] in [place]) to become qualified for the adult dental clinic ° °[facility] °[address] °[place], [place] [address] °(336) [phone] °[email or web address] ° °Other Low-Cost Community Dental Services: °- [facility]- [address], [place], [place], [address], [phone], Ext. 123, 2nd and 4th Thursday of the month at 6:30am.  10 clients   each day by appointment, can sometimes see walk-in patients if someone does not show for an appointment. °- [facility]- [address], [place], [place], [address], [phone] °- [facility]- [address], [place], , [address], [phone] °- [facility]- [phone] °- [facility]- [phone] °- [facility]- [phone] °-  °

## 2015-05-25 NOTE — ED Notes (Signed)
Pt reports chest pain, neck pain and elbow pain x 3 days. Pt reports relief when taking a hot bath and taking a muscle relaxer. Denies injury. Pt is in no acute distress. Does have a 3 month old and has to carry him around.

## 2015-05-25 NOTE — ED Provider Notes (Signed)
CSN: [id number]     Arrival date & time 05/25/15  2113 History   First MD Initiated Contact with Patient 05/25/15 2242     No chief complaint on file.    (Consider location/radiation/quality/duration/timing/severity/associated sxs/prior Treatment) HPI Comments: The patient is a 35 year old female, she denies a history of cardiac disease, in fact she states that she had a heart catheterization the past and had no signs of heart blockages. She is unclear exactly what time it was. She reports that she has been having some chest pain, neck pain and bilateral arm pain that is worse with moving, worse with change of position, worse with bending over, not worse with taking a deep breath or walking. She has no history of exertional symptoms. She reports that she does have to carry her 30-month-old child around a lot and thinks that she may have been pulling muscles related to that. There is been no fevers chills nausea vomiting or swelling of the lower extremity.  The history is provided by the patient.    Past Medical History  Diagnosis Date  . GERD (gastroesophageal reflux disease)   . Indigestion   . Ectopic pregnancy   . Headache(784.0)   . Infection     UTI  . Fibromyalgia     'had it before' no problems in 5-108yrs  . Pregnancy induced hypertension     with first   Past Surgical History  Procedure Laterality Date  . Cesarean section    . Therapeutic abortion    . Cardiac catheterization      not a cardiac problem- really bad acid reflex  . Cholecystectomy N/A 10/11/2013    Procedure: LAPAROSCOPIC CHOLECYSTECTOMY;  Surgeon: Shelly Rubenstein, MD;  Location: [facility];  Service: General;  Laterality: N/A;  . Cardiac catheterization  2006   Family History  Problem Relation Age of Onset  . Diabetes Maternal Aunt   . Cancer Maternal Aunt     mother's aunt Br Ca  . Diabetes Maternal Grandmother   . Hypertension Maternal Grandmother   . Heart disease Maternal Grandmother   . Other Neg  Hx    Social History  Substance Use Topics  . Smoking status: Former Games developer  . Smokeless tobacco: Never Used     Comment: quit 2009  . Alcohol Use: No   OB History    Gravida Para Term Preterm AB TAB SAB Ectopic Multiple Living   Review of Systems  All other systems reviewed and are negative.     Allergies  Latex and Tomato  Home Medications   Prior to Admission medications   Medication Sig Start Date End Date Taking? Authorizing Provider  cetirizine (ZYRTEC) 10 MG tablet Take 10 mg by mouth daily as needed for allergies.   Yes Historical Provider, MD  ibuprofen (ADVIL,MOTRIN) 200 MG tablet Take 600 mg by mouth every 6 (six) hours as needed for moderate pain.   Yes Historical Provider, MD  diclofenac (VOLTAREN) 75 MG EC tablet Take 1 tablet (75 mg total) by mouth 2 (two) times daily. Patient not taking: Reported on 04/04/2015 01/15/15   Ozella Rocks, MD  ibuprofen (ADVIL,MOTRIN) 600 MG tablet Take 1 tablet (600 mg total) by mouth every 6 (six) hours as needed. Patient not taking: Reported on 05/25/2015 04/04/15   Mady Gemma, PA-C  methocarbamol (ROBAXIN) 500 MG tablet Take 1-2 tablets (500-1,000 mg total) by mouth every 6 (six) hours  as needed for muscle spasms. Patient not taking: Reported on 04/04/2015 01/15/15   Ozella Rocks, MD  predniSONE (DELTASONE) 20 MG tablet Take 2 tablets (40 mg total) by mouth daily. Patient not taking: Reported on 05/25/2015 04/04/15   Mady Gemma, PA-C   BP 111/76 mmHg  Pulse 80  Temp(Src) 98.2 F (36.8 C) (Oral)  Resp 18  SpO2 98%  LMP 05/02/2015 Physical Exam  Constitutional: She appears well-developed and well-nourished. No distress.  HENT:  Head: Normocephalic and atraumatic.  Mouth/Throat: Oropharynx is clear and moist. No oropharyngeal exudate.  Eyes: Conjunctivae and EOM are normal. Pupils are equal, round, and reactive to light. Right eye exhibits no discharge. Left eye exhibits no  discharge. No scleral icterus.  Neck: Normal range of motion. Neck supple. No JVD present. No thyromegaly present.  Cardiovascular: Normal rate, regular rhythm, normal heart sounds and intact distal pulses.  Exam reveals no gallop and no friction rub.   No murmur heard. Pulmonary/Chest: Effort normal and breath sounds normal. No respiratory distress. She has no wheezes. She has no rales.  Abdominal: Soft. Bowel sounds are normal. She exhibits no distension and no mass. There is no tenderness.  Musculoskeletal: Normal range of motion. She exhibits tenderness ( Reversible tenderness to palpation in the bilateral trapezius, rhomboid, bilateral upper chest and arms. Worse with range motion of the arms, worse with change in position). She exhibits no edema.  Lymphadenopathy:    She has no cervical adenopathy.  Neurological: She is alert. Coordination normal.  Normal strength and sensation of the bilateral upper extremity  Skin: Skin is warm and dry. No rash noted. No erythema.  Psychiatric: She has a normal mood and affect. Her behavior is normal.  Nursing note and vitals reviewed.   ED Course  Procedures (including critical care time) Labs Review Labs Reviewed  BASIC METABOLIC PANEL  CBC  I-STAT TROPOININ, ED    Imaging Review Dg Chest 2 View  05/25/2015  CLINICAL DATA:  Chest pain radiating down both arms. EXAM: CHEST  2 VIEW COMPARISON:  08/27/2014 FINDINGS: The heart size and mediastinal contours are within normal limits. Both lungs are clear. The visualized skeletal structures are unremarkable. IMPRESSION: No active cardiopulmonary disease. Electronically Signed   By: Burman Nieves M.D.   On: 05/25/2015 23:05   I have personally reviewed and evaluated these images and lab results as part of my medical decision-making.   EKG Interpretation   Date/Time:  Sunday May 25 2015 21:24:32 EST Ventricular Rate:  77 PR Interval:  168 QRS Duration: 98 QT Interval:  346 QTC  Calculation: 391 R Axis:   45 Text Interpretation:  Sinus rhythm RSR' in V1 or V2, probably normal  variant since last tracing no significant change Confirmed by Shalanda Brogden  MD,  Addis Tuohy ([id number]) on 05/25/2015 11:34:04 PM      MDM   Final diagnoses:  Chest pain, unspecified chest pain type     The patient has a normal x-ray, normal EKG, normal vital signs, reversible pain with range of motion. Doubt exertional symptoms, this does not seem to be cardiac in nature, the patient appears stable for discharge. Encouraged anti-inflammatory use. The patient expresses her understanding.  In addition to written d/c instructions, the pt was given verbal d/c instructions including the indications for return and expressed understanding to the instructions.   Eber Hong, MD 05/25/15 [phone]

## 2015-05-25 NOTE — ED Notes (Addendum)
Writer was informed from RN that blood work will be cancel

## 2015-07-25 ENCOUNTER — Emergency Department (HOSPITAL_COMMUNITY)
Admission: EM | Admit: 2015-07-25 | Discharge: 2015-07-25 | Disposition: A | Discharge status: Home / Self Care | Payer: Medicaid Other | Attending: Emergency Medicine | Admitting: Emergency Medicine

## 2015-07-25 ENCOUNTER — Encounter (HOSPITAL_COMMUNITY): Payer: Self-pay | Admitting: Emergency Medicine

## 2015-07-25 DIAGNOSIS — Z9889 Other specified postprocedural states: Secondary | ICD-10-CM | POA: Insufficient documentation

## 2015-07-25 DIAGNOSIS — Z91040 Latex allergy status: Secondary | ICD-10-CM | POA: Insufficient documentation

## 2015-07-25 DIAGNOSIS — Z3202 Encounter for pregnancy test, result negative: Secondary | ICD-10-CM | POA: Insufficient documentation

## 2015-07-25 DIAGNOSIS — Z87891 Personal history of nicotine dependence: Secondary | ICD-10-CM | POA: Insufficient documentation

## 2015-07-25 DIAGNOSIS — Z791 Long term (current) use of non-steroidal anti-inflammatories (NSAID): Secondary | ICD-10-CM | POA: Insufficient documentation

## 2015-07-25 DIAGNOSIS — M545 Low back pain, unspecified: Secondary | ICD-10-CM

## 2015-07-25 DIAGNOSIS — Z8719 Personal history of other diseases of the digestive system: Secondary | ICD-10-CM | POA: Insufficient documentation

## 2015-07-25 DIAGNOSIS — Z87440 Personal history of urinary (tract) infections: Secondary | ICD-10-CM | POA: Insufficient documentation

## 2015-07-25 LAB — COMPREHENSIVE METABOLIC PANEL
ALK PHOS: 84 U/L (ref 38–126)
ALT: 14 U/L (ref 14–54)
ANION GAP: 7 (ref 5–15)
AST: 20 U/L (ref 15–41)
Albumin: 3.8 g/dL (ref 3.5–5.0)
BUN: 13 mg/dL (ref 6–20)
CALCIUM: 9.1 mg/dL (ref 8.9–10.3)
CO2: 28 mmol/L (ref 22–32)
Chloride: 105 mmol/L (ref 101–111)
Creatinine, Ser: 0.78 mg/dL (ref 0.44–1.00)
GFR calc non Af Amer: >60 mL/min (ref >60)
Glucose, Bld: 85 mg/dL (ref 65–99)
Potassium: 3.8 mmol/L (ref 3.5–5.1)
SODIUM: 140 mmol/L (ref 135–145)
Total Bilirubin: 0.5 mg/dL (ref 0.3–1.2)
Total Protein: 7.5 g/dL (ref 6.5–8.1)

## 2015-07-25 LAB — URINALYSIS, ROUTINE W REFLEX MICROSCOPIC
Bilirubin Urine: NEGATIVE
Glucose, UA: NEGATIVE mg/dL
HGB URINE DIPSTICK: NEGATIVE
Ketones, ur: NEGATIVE mg/dL
LEUKOCYTES UA: NEGATIVE
NITRITE: NEGATIVE
Protein, ur: NEGATIVE mg/dL
SPECIFIC GRAVITY, URINE: 1.023 (ref 1.005–1.030)
pH: 5 (ref 5.0–8.0)

## 2015-07-25 LAB — I-STAT BETA HCG BLOOD, ED (MC, WL, AP ONLY)

## 2015-07-25 LAB — CBC
HCT: 38.7 % (ref 36.0–46.0)
HEMOGLOBIN: 12.4 g/dL (ref 12.0–15.0)
MCH: 28 pg (ref 26.0–34.0)
MCHC: 32 g/dL (ref 30.0–36.0)
MCV: 87.4 fL (ref 78.0–100.0)
Platelets: 250 10*3/uL (ref 150–400)
RBC: 4.43 MIL/uL (ref 3.87–5.11)
RDW: 13.1 % (ref 11.5–15.5)
WBC: 7.7 10*3/uL (ref 4.0–10.5)

## 2015-07-25 LAB — LIPASE, BLOOD: LIPASE: 27 U/L (ref 11–51)

## 2015-07-25 MED ORDER — DIAZEPAM 2 MG PO TABS: 2.0000 mg | ORAL_TABLET | Freq: Four times a day (QID) | ORAL | Status: DC | PRN

## 2015-07-25 MED ORDER — NAPROXEN 375 MG PO TABS: 375.0000 mg | ORAL_TABLET | Freq: Two times a day (BID) | ORAL | Status: DC

## 2015-07-25 NOTE — ED Provider Notes (Signed)
CSN: [id number]     Arrival date & time 07/25/15  1846 History   First MD Initiated Contact with Patient 07/25/15 2110     Chief Complaint  Patient presents with  . Flank Pain     (Consider location/radiation/quality/duration/timing/severity/associated sxs/prior Treatment) HPI Comments: LBP x 2 days described as dull and non-radiating, no change to bowel/bladder fx No hematuria or dysuria but urine was cloudy No rashes No LE neuro deficits Took pcn thinking it was an infection with some relief  Patient is a 35 y.o. female presenting with flank pain. The history is provided by the patient.  Flank Pain This is a new problem. The current episode started more than 2 days ago. The problem occurs constantly. The problem has been gradually improving. Pertinent negatives include no chest pain and no abdominal pain. The symptoms are aggravated by walking. The symptoms are relieved by position.    Past Medical History  Diagnosis Date  . GERD (gastroesophageal reflux disease)   . Indigestion   . Ectopic pregnancy   . Headache(784.0)   . Infection     UTI  . Fibromyalgia     'had it before' no problems in 5-3629yrs  . Pregnancy induced hypertension     with first   Past Surgical History  Procedure Laterality Date  . Cesarean section    . Therapeutic abortion    . Cardiac catheterization      not a cardiac problem- really bad acid reflex  . Cholecystectomy N/A 10/11/2013    Procedure: LAPAROSCOPIC CHOLECYSTECTOMY;  Surgeon: Shelly Rubensteinouglas A Blackman, MD;  Location: [facility];  Service: General;  Laterality: N/A;  . Cardiac catheterization  2006   Family History  Problem Relation Age of Onset  . Diabetes Maternal Aunt   . Cancer Maternal Aunt     mother's aunt Br Ca  . Diabetes Maternal Grandmother   . Hypertension Maternal Grandmother   . Heart disease Maternal Grandmother   . Other Neg Hx    Social History  Substance Use Topics  . Smoking status: Former Games developermoker  . Smokeless tobacco:  Never Used     Comment: quit 2009  . Alcohol Use: No   OB History    Gravida Para Term Preterm AB TAB SAB Ectopic Multiple Living   7 3 3  4 1 2 1  3      Review of Systems  Cardiovascular: Negative for chest pain.  Gastrointestinal: Negative for abdominal pain.  Genitourinary: Positive for flank pain.  All other systems reviewed and are negative.     Allergies  Latex and Tomato  Home Medications   Prior to Admission medications   Medication Sig Start Date End Date Taking? Authorizing Provider  cetirizine (ZYRTEC) 10 MG tablet Take 10 mg by mouth daily as needed for allergies.    Historical Provider, MD  diclofenac (VOLTAREN) 75 MG EC tablet Take 1 tablet (75 mg total) by mouth 2 (two) times daily. Patient not taking: Reported on 04/04/2015 01/15/15   Ozella Rocksavid J Merrell, MD  ibuprofen (ADVIL,MOTRIN) 800 MG tablet Take 1 tablet (800 mg total) by mouth 3 (three) times daily. 05/25/15   Eber HongBrian Miller, MD  methocarbamol (ROBAXIN) 500 MG tablet Take 1 tablet (500 mg total) by mouth 2 (two) times daily as needed for muscle spasms. 05/25/15   Eber HongBrian Miller, MD   BP 131/86 mmHg  Pulse 80  Temp(Src) 98.2 F (36.8 C) (Oral)  Resp 16  SpO2 100% Physical Exam  Constitutional: She is oriented to  person, place, and time. She appears well-developed and well-nourished.  Non-toxic appearance. No distress.  HENT:  Head: Normocephalic and atraumatic.  Eyes: Conjunctivae, EOM and lids are normal. Pupils are equal, round, and reactive to light.  Neck: Normal range of motion. Neck supple. No tracheal deviation present. No thyroid mass present.  Cardiovascular: Normal rate, regular rhythm and normal heart sounds.  Exam reveals no gallop.   No murmur heard. Pulmonary/Chest: Effort normal and breath sounds normal. No stridor. No respiratory distress. She has no decreased breath sounds. She has no wheezes. She has no rhonchi. She has no rales.  Abdominal: Soft. Normal appearance and bowel sounds are  normal. She exhibits no distension. There is no tenderness. There is no rebound and no CVA tenderness.  Musculoskeletal: Normal range of motion. She exhibits no edema or tenderness.       Back:  Neurological: She is alert and oriented to person, place, and time. She has normal strength. No cranial nerve deficit or sensory deficit. GCS eye subscore is 4. GCS verbal subscore is 5. GCS motor subscore is 6.  Skin: Skin is warm and dry. No abrasion and no rash noted.  Psychiatric: She has a normal mood and affect. Her speech is normal and behavior is normal.  Nursing note and vitals reviewed.   ED Course  Procedures (including critical care time) Labs Review Labs Reviewed  LIPASE, BLOOD  COMPREHENSIVE METABOLIC PANEL  CBC  URINALYSIS, ROUTINE W REFLEX MICROSCOPIC (NOT AT [facility])  I-STAT BETA HCG BLOOD, ED ([facility], [facility], [facility] ONLY)    Imaging Review No results found. I have personally reviewed and evaluated these images and lab results as part of my medical decision-making.   EKG Interpretation None      MDM   Final diagnoses:  None   Pt with likely msk back pain, will tx with muscle relaxants    Lorre Nick, MD 07/25/15 2119

## 2015-07-25 NOTE — Discharge Instructions (Signed)

## 2015-07-25 NOTE — ED Notes (Signed)
Pt c/o low back pain and flank pain, positive CVA tenderness bilaterally. Pt states this weekend she was drinking more tea and flavored drinks and less water. States pain has progressed to abdominal pain. No nausea or emesis. Pt states her urine this morning was "neon green." No hematuria, no dysuria.

## 2015-08-09 ENCOUNTER — Encounter (HOSPITAL_COMMUNITY): Payer: Self-pay | Admitting: Emergency Medicine

## 2015-08-09 ENCOUNTER — Emergency Department (HOSPITAL_COMMUNITY): Payer: Medicaid Other

## 2015-08-09 ENCOUNTER — Emergency Department (HOSPITAL_COMMUNITY)
Admission: EM | Admit: 2015-08-09 | Discharge: 2015-08-09 | Disposition: A | Discharge status: Home / Self Care | Payer: Medicaid Other | Attending: Emergency Medicine | Admitting: Emergency Medicine

## 2015-08-09 DIAGNOSIS — Z3202 Encounter for pregnancy test, result negative: Secondary | ICD-10-CM | POA: Insufficient documentation

## 2015-08-09 DIAGNOSIS — J40 Bronchitis, not specified as acute or chronic: Secondary | ICD-10-CM

## 2015-08-09 DIAGNOSIS — Z91040 Latex allergy status: Secondary | ICD-10-CM | POA: Insufficient documentation

## 2015-08-09 DIAGNOSIS — Z87891 Personal history of nicotine dependence: Secondary | ICD-10-CM | POA: Insufficient documentation

## 2015-08-09 DIAGNOSIS — Z791 Long term (current) use of non-steroidal anti-inflammatories (NSAID): Secondary | ICD-10-CM | POA: Insufficient documentation

## 2015-08-09 DIAGNOSIS — Z9889 Other specified postprocedural states: Secondary | ICD-10-CM | POA: Insufficient documentation

## 2015-08-09 DIAGNOSIS — Z8719 Personal history of other diseases of the digestive system: Secondary | ICD-10-CM | POA: Insufficient documentation

## 2015-08-09 DIAGNOSIS — Z87440 Personal history of urinary (tract) infections: Secondary | ICD-10-CM | POA: Insufficient documentation

## 2015-08-09 DIAGNOSIS — Z8739 Personal history of other diseases of the musculoskeletal system and connective tissue: Secondary | ICD-10-CM | POA: Insufficient documentation

## 2015-08-09 DIAGNOSIS — J209 Acute bronchitis, unspecified: Secondary | ICD-10-CM | POA: Insufficient documentation

## 2015-08-09 LAB — PREGNANCY, URINE: PREG TEST UR: NEGATIVE

## 2015-08-09 MED ORDER — BENZONATATE 100 MG PO CAPS: 100.0000 mg | ORAL_CAPSULE | Freq: Three times a day (TID) | ORAL | Status: DC

## 2015-08-09 MED ORDER — AZITHROMYCIN 250 MG PO TABS: 250.0000 mg | ORAL_TABLET | Freq: Every day | ORAL | Status: DC

## 2015-08-09 NOTE — ED Provider Notes (Signed)
CSN: [id number]     Arrival date & time 08/09/15  1513 History  By signing my name below, I, [name], attest that this documentation has been prepared under the direction and in the presence of Langston Masker, PA-C Electronically Signed: Soijett Morrow, ED Scribe. 08/09/2015. 4:08 PM.   Chief Complaint  Patient presents with  . Cough      The history is provided by the patient. No language interpreter was used.    [name] is a 35 y.o. female who presents to the Emergency Department complaining of productive cough x green sputum onset 4 days. Pt denies getting a flu shot this past year. Pt denies seasonal allergies. Pt reports that her symptoms are typically worsened in the morning. Pt denies PMHx of pneumonia or flu. Pt notes that she is having associated symptoms of voice change, subjective fever, rhinorrhea green in color, and CP due to cough. She states that she has not tried any medications for the relief for her symptoms. She denies chills, color change, rash, wound, and any other symptoms.  Pt secondarily notes that she would like a pregnancy test completed due to being late for her menstrual. Patient's last menstrual period was 07/01/2015. Pt denies any other symptoms at this time.    Past Medical History  Diagnosis Date  . GERD (gastroesophageal reflux disease)   . Indigestion   . Ectopic pregnancy   . Headache(784.0)   . Infection     UTI  . Fibromyalgia     'had it before' no problems in 5-20yrs  . Pregnancy induced hypertension     with first   Past Surgical History  Procedure Laterality Date  . Cesarean section    . Therapeutic abortion    . Cardiac catheterization      not a cardiac problem- really bad acid reflex  . Cholecystectomy N/A 10/11/2013    Procedure: LAPAROSCOPIC CHOLECYSTECTOMY;  Surgeon: Shelly Rubenstein, MD;  Location: [facility];  Service: General;  Laterality: N/A;  . Cardiac catheterization  2006   Family History  Problem Relation Age of  Onset  . Diabetes Maternal Aunt   . Cancer Maternal Aunt     mother's aunt Br Ca  . Diabetes Maternal Grandmother   . Hypertension Maternal Grandmother   . Heart disease Maternal Grandmother   . Other Neg Hx    Social History  Substance Use Topics  . Smoking status: Former Games developer  . Smokeless tobacco: Never Used     Comment: quit 2009  . Alcohol Use: No   OB History    Gravida Para Term Preterm AB TAB SAB Ectopic Multiple Living   Review of Systems  Constitutional: Positive for fever (subjective). Negative for chills.  HENT: Positive for rhinorrhea and voice change.   Respiratory: Positive for cough.   Cardiovascular: Positive for chest pain (due to cough).  Skin: Negative for color change, rash and wound.  All other systems reviewed and are negative.     Allergies  Latex and Tomato  Home Medications   Prior to Admission medications   Medication Sig Start Date End Date Taking? Authorizing Provider  cetirizine (ZYRTEC) 10 MG tablet Take 10 mg by mouth daily as needed for allergies.    Historical Provider, MD  diazepam (VALIUM) 2 MG tablet Take 1 tablet (2 mg total) by mouth every 6 (six) hours as needed for muscle spasms. 07/25/15  Lorre Nick, MD  diclofenac (VOLTAREN) 75 MG EC tablet Take 1 tablet (75 mg total) by mouth 2 (two) times daily. Patient not taking: Reported on 04/04/2015 01/15/15   Ozella Rocks, MD  methocarbamol (ROBAXIN) 500 MG tablet Take 1 tablet (500 mg total) by mouth 2 (two) times daily as needed for muscle spasms. 05/25/15   Eber Hong, MD  naproxen (NAPROSYN) 375 MG tablet Take 1 tablet (375 mg total) by mouth 2 (two) times daily with a meal. 07/25/15   Lorre Nick, MD   BP 108/96 mmHg  Pulse 103  Temp(Src) 98.1 F (36.7 C) (Oral)  Resp 18  SpO2 100%  LMP 07/01/2015 Physical Exam  Constitutional: She is oriented to person, place, and time. She appears well-developed and well-nourished. No distress.  HENT:  Head:  Normocephalic and atraumatic.  Eyes: EOM are normal.  Neck: Neck supple.  Cardiovascular: Normal rate, regular rhythm and normal heart sounds.  Exam reveals no gallop and no friction rub.   No murmur heard. Pulmonary/Chest: Effort normal. No respiratory distress. She has no wheezes. She has no rales.  Harsh breath sounds  Musculoskeletal: Normal range of motion.  Neurological: She is alert and oriented to person, place, and time.  Skin: Skin is warm and dry.  Psychiatric: She has a normal mood and affect. Her behavior is normal.  Nursing note and vitals reviewed.   ED Course  Procedures (including critical care time) DIAGNOSTIC STUDIES: Oxygen Saturation is 100% on RA, nl by my interpretation.    COORDINATION OF CARE: 4:05 PM Discussed treatment plan with pt at bedside which includes CXR and UA and pt agreed to plan.   Labs Review Labs Reviewed  PREGNANCY, URINE    Imaging Review Dg Chest 2 View  08/09/2015  CLINICAL DATA:  Productive cough and pain EXAM: CHEST  2 VIEW COMPARISON:  May 25, 2015 FINDINGS: The heart size and mediastinal contours are within normal limits. Both lungs are clear. The visualized skeletal structures are unremarkable. IMPRESSION: No active cardiopulmonary disease. Electronically Signed   By: Gerome Sam III M.D   On: 08/09/2015 16:30   I have personally reviewed and evaluated these images and lab results as part of my medical decision-making.   EKG Interpretation None      MDM     Final diagnoses:  Bronchitis    Meds ordered this encounter  Medications  . azithromycin (ZITHROMAX) 250 MG tablet    Sig: Take 1 tablet (250 mg total) by mouth daily. Take first 2 tablets together, then 1 every day until finished.    Dispense:  6 tablet    Refill:  0    Order Specific Question:  Supervising Provider    Answer:  MILLER, BRIAN [[id number]]  . benzonatate (TESSALON) 100 MG capsule    Sig: Take 1 capsule (100 mg total) by mouth every 8 (eight)  hours.    Dispense:  21 capsule    Refill:  0    Order Specific Question:  Supervising Provider    Answer:  Eber Hong [[id number]]     Medication List    TAKE these medications        azithromycin 250 MG tablet  Commonly known as:  ZITHROMAX  Take 1 tablet (250 mg total) by mouth daily. Take first 2 tablets together, then 1 every day until finished.     benzonatate 100 MG capsule  Commonly known as:  TESSALON  Take 1 capsule (100 mg total) by mouth every 8 (  eight) hours.      ASK your doctor about these medications        cetirizine 10 MG tablet  Commonly known as:  ZYRTEC  Take 10 mg by mouth daily as needed for allergies.     diazepam 2 MG tablet  Commonly known as:  VALIUM  Take 1 tablet (2 mg total) by mouth every 6 (six) hours as needed for muscle spasms.     diclofenac 75 MG EC tablet  Commonly known as:  VOLTAREN  Take 1 tablet (75 mg total) by mouth 2 (two) times daily.     methocarbamol 500 MG tablet  Commonly known as:  ROBAXIN  Take 1 tablet (500 mg total) by mouth 2 (two) times daily as needed for muscle spasms.     naproxen 375 MG tablet  Commonly known as:  NAPROSYN  Take 1 tablet (375 mg total) by mouth 2 (two) times daily with a meal.      An After Visit Summary was printed and given to the patient.  Elson AreasLeslie K Seichi Kaufhold, PA-C 08/09/15 1814  Elson AreasLeslie K Kalvin Buss, PA-C 08/09/15 1815  Lorre NickAnthony Allen, MD 08/13/15 [phone]

## 2015-08-09 NOTE — Discharge Instructions (Signed)

## 2015-08-09 NOTE — ED Notes (Signed)
Pt complaint of productive cough and central chest pain with cough.

## 2015-12-24 ENCOUNTER — Emergency Department (HOSPITAL_COMMUNITY)
Admission: EM | Admit: 2015-12-24 | Discharge: 2015-12-24 | Disposition: A | Discharge status: Home / Self Care | Payer: Medicaid Other | Attending: Emergency Medicine | Admitting: Emergency Medicine

## 2015-12-24 ENCOUNTER — Encounter (HOSPITAL_COMMUNITY): Payer: Self-pay | Admitting: Emergency Medicine

## 2015-12-24 ENCOUNTER — Emergency Department (HOSPITAL_COMMUNITY): Payer: Self-pay

## 2015-12-24 DIAGNOSIS — M5431 Sciatica, right side: Secondary | ICD-10-CM

## 2015-12-24 DIAGNOSIS — Z91040 Latex allergy status: Secondary | ICD-10-CM | POA: Insufficient documentation

## 2015-12-24 DIAGNOSIS — M5441 Lumbago with sciatica, right side: Secondary | ICD-10-CM

## 2015-12-24 DIAGNOSIS — Z87891 Personal history of nicotine dependence: Secondary | ICD-10-CM | POA: Insufficient documentation

## 2015-12-24 DIAGNOSIS — M5442 Lumbago with sciatica, left side: Secondary | ICD-10-CM | POA: Insufficient documentation

## 2015-12-24 DIAGNOSIS — M5432 Sciatica, left side: Secondary | ICD-10-CM

## 2015-12-24 LAB — POC URINE PREG, ED: Preg Test, Ur: NEGATIVE

## 2015-12-24 MED ORDER — CYCLOBENZAPRINE HCL 10 MG PO TABS: 10.0000 mg | ORAL_TABLET | Freq: Every day | ORAL | 0 refills | Status: DC

## 2015-12-24 MED ORDER — PREDNISONE 10 MG PO TABS: 40.0000 mg | ORAL_TABLET | Freq: Every day | ORAL | 0 refills | Status: DC

## 2015-12-24 MED ORDER — IBUPROFEN 200 MG PO TABS
600.0000 mg | ORAL_TABLET | Freq: Once | ORAL | Status: AC
  Administered 2015-12-24: 600 mg via ORAL
  Filled 2015-12-24: qty 3

## 2015-12-24 MED ORDER — PREDNISONE 50 MG PO TABS
50.0000 mg | ORAL_TABLET | Freq: Once | ORAL | Status: AC
  Administered 2015-12-24: 50 mg via ORAL
  Filled 2015-12-24: qty 1

## 2015-12-24 MED ORDER — HYDROCODONE-ACETAMINOPHEN 5-325 MG PO TABS: 1.0000 | ORAL_TABLET | Freq: Four times a day (QID) | ORAL | 0 refills | Status: DC | PRN

## 2015-12-24 NOTE — ED Triage Notes (Signed)
Pt c/o lowe back pain x a few weeks now. Pt sts recently the pain has started to shoot down her legs into her feet. Pt c/o bilateral pain but sts it is worse on the L side. Pt sts her feet and lower legs have started to feel numb. Pt sts pain is worse when sitting but still uncomfortable when she stands. Pt sts she started a new job recently and the way she was sitting in her chair was bothering her back. She has switched chairs but the pain continues. Pt able to feel me palpate both feet and legs. A&Ox4 and ambulatory.

## 2015-12-24 NOTE — ED Provider Notes (Signed)
[facility]-EMERGENCY DEPT Provider Note   CSN: [id number] Arrival date & time: 12/24/15  1306     History   Chief Complaint Chief Complaint  Patient presents with  . Back Pain  . Leg Pain    HPI [name] is a 35 y.o. female.  HPI  Patient is a 35 year old female with a history of fibromyalgia, chronic back pain, and GERD who presents to the emergency department with Progressively worsening back pain radiating into her posterior bilateral legs for one week. Patient has had intermittent back pain with sciatica for 2 years. She states the pain is similar to previous episodes but is worse. Patient started a new job in June which requires a lot of sitting. She states her symptoms have worsened since then. Right-sided achy lower back pain radiating into the legs is a 5/10. Left sided achy lower back pain radiating into her legs is a 7/10. She is complaining of worsening squeezing/throbbing pain in her posterior bilateral knees, 9/10. Associated intermittent tingling in her bilateral feet. Patient denies saddle anesthesia, loss of bowel bladder function, headaches, dizziness, visual changes, but all pain, nausea, vomiting, dysuria, hematuria, change in bowel habits.  Past Medical History:  Diagnosis Date  . Ectopic pregnancy   . Fibromyalgia    'had it before' no problems in 5-43yrs  . GERD (gastroesophageal reflux disease)   . Headache(784.0)   . Indigestion   . Infection    UTI  . Pregnancy induced hypertension    with first    Patient Active Problem List   Diagnosis Date Noted  . Active labor 12/27/2013  . Encounter for trial of labor 12/27/2013  . NSVD (normal spontaneous vaginal delivery) 12/27/2013  . [redacted] weeks gestation of pregnancy 10/12/2013    Past Surgical History:  Procedure Laterality Date  . CARDIAC CATHETERIZATION     not a cardiac problem- really bad acid reflex  . CARDIAC CATHETERIZATION  2006  . CESAREAN SECTION    . CHOLECYSTECTOMY N/A 10/11/2013   Procedure: LAPAROSCOPIC CHOLECYSTECTOMY;  Surgeon: Shelly Rubenstein, MD;  Location: [facility];  Service: General;  Laterality: N/A;  . THERAPEUTIC ABORTION      OB History    Gravida Para Term Preterm AB Living   7 3 3   4 3    SAB TAB Ectopic Multiple Live Births   2 1 1   3        Home Medications    Prior to Admission medications   Medication Sig Start Date End Date Taking? Authorizing Provider  cetirizine (ZYRTEC) 10 MG tablet Take 10 mg by mouth daily as needed for allergies.   Yes Historical Provider, MD  azithromycin (ZITHROMAX) 250 MG tablet Take 1 tablet (250 mg total) by mouth daily. Take first 2 tablets together, then 1 every day until finished. Patient not taking: Reported on 12/24/2015 08/09/15   Elson Areas, PA-C  benzonatate (TESSALON) 100 MG capsule Take 1 capsule (100 mg total) by mouth every 8 (eight) hours. Patient not taking: Reported on 12/24/2015 08/09/15   Elson Areas, PA-C  cyclobenzaprine (FLEXERIL) 10 MG tablet Take 1 tablet (10 mg total) by mouth at bedtime. 12/24/15   Jerre Simon, PA  diazepam (VALIUM) 2 MG tablet Take 1 tablet (2 mg total) by mouth every 6 (six) hours as needed for muscle spasms. Patient not taking: Reported on 12/24/2015 07/25/15   Lorre Nick, MD  HYDROcodone-acetaminophen (NORCO/VICODIN) 5-325 MG tablet Take 1 tablet by mouth every 6 (six) hours as  needed. 12/24/15   Jerre SimonJessica L Anira Sen[place], PA  naproxen (NAPROSYN) 375 MG tablet Take 1 tablet (375 mg total) by mouth 2 (two) times daily with a meal. Patient not taking: Reported on 12/24/2015 07/25/15   Lorre NickAnthony Allen, MD  predniSONE (DELTASONE) 10 MG tablet Take 4 tablets (40 mg total) by mouth daily. 12/24/15   Jerre SimonJessica L Yvett Rossel, PA    Family History Family History  Problem Relation Age of Onset  . Diabetes Maternal Aunt   . Cancer Maternal Aunt     mother's aunt Br Ca  . Diabetes Maternal Grandmother   . Hypertension Maternal Grandmother   . Heart disease Maternal Grandmother   . Other Neg Hx      Social History Social History  Substance Use Topics  . Smoking status: Former Games developermoker  . Smokeless tobacco: Never Used     Comment: quit 2009  . Alcohol use No     Allergies   Latex and Tomato   Review of Systems Review of Systems  Constitutional: Negative for chills, fever and unexpected weight change.  Eyes: Negative for visual disturbance.  Respiratory: Negative for shortness of breath.   Cardiovascular: Negative for chest pain and leg swelling.  Gastrointestinal: Negative for abdominal pain, nausea and vomiting.  Genitourinary: Negative for dysuria, hematuria and vaginal discharge.  Musculoskeletal: Positive for back pain. Negative for gait problem.  Neurological: Positive for numbness. Negative for dizziness, syncope, weakness and headaches.     Physical Exam Updated Vital Signs BP (!) 122/52 (BP Location: Left Arm)   Pulse 68   Temp 98.3 F (36.8 C) (Oral)   Resp 16   LMP 11/15/2015   SpO2 99%   Physical Exam  Physical Exam  Constitutional: Pt appears well-developed and well-nourished. No distress.  HENT:  Head: Normocephalic and atraumatic.  Mouth/Throat: Oropharynx is clear and moist. No oropharyngeal exudate.  Eyes: Conjunctivae are normal.  Neck: Normal range of motion. Neck supple, no cervical spinal tenderness, mild paraspinal muscle tenderness.  Full ROM without pain  Cardiovascular: Normal rate, regular rhythm and intact distal pulses, 2+ DP pulses.   Pulmonary/Chest: Effort normal and breath sounds normal. No respiratory distress. Pt has no wheezes.  Abdominal: Soft. Pt exhibits no distension. There is no tenderness, no CVA tenderness.  Musculoskeletal:  Full range of motion of the T-spine and L-spine No tenderness to palpation of the spinous processes of the T-spine or L-spine Mild tenderness to palpation of the paraspinous muscles of the L-spine bilaterally and moderately TTP to the SI joint on the left side Pain with straight leg raise.  TTP of the hamstring muscles bilaterally, palpation worsens pain in posterior bilateral knees    Neurological:  Speech is clear and goal oriented, follows commands Normal 5/5 strength in upper and lower extremities bilaterally including dorsiflexion and plantar flexion, strong and equal grip strength Sensation normal to light and sharp touch Moves extremities without ataxia, coordination intact Normal gait Normal balance No Clonus  Skin: Skin is warm and dry. No rash noted. Pt is not diaphoretic. No erythema.  Psychiatric: Pt has a normal mood and affect. Behavior is normal.  Nursing note and vitals reviewed.    ED Treatments / Results  Labs (all labs ordered are listed, but only abnormal results are displayed) Labs Reviewed  POC URINE PREG, ED    EKG  EKG Interpretation None       Radiology Dg Lumbar Spine Complete  Result Date: 12/24/2015 CLINICAL DATA:  Worsening low back pain over the  past 2 months. No known injury. Initial encounter. EXAM: LUMBAR SPINE - COMPLETE 4+ VIEW COMPARISON:  CT abdomen and pelvis 08/27/2014. FINDINGS: There is no evidence of lumbar spine fracture. Alignment is normal. Intervertebral disc spaces are maintained. IMPRESSION: Negative exam. Electronically Signed   By: Drusilla Kannerhomas  Dalessio M.D.   On: 12/24/2015 20:15    Procedures Procedures (including critical care time)  Medications Ordered in ED Medications  ibuprofen (ADVIL,MOTRIN) tablet 600 mg (600 mg Oral Given 12/24/15 1849)  predniSONE (DELTASONE) tablet 50 mg (50 mg Oral Given 12/24/15 2050)     Initial Impression / Assessment and Plan / ED Course  I have reviewed the triage vital signs and the nursing notes.  Pertinent labs & imaging results that were available during my care of the patient were reviewed by me and considered in my medical decision making (see chart for details).  Clinical Course   Patient with back pain.  No neurological deficits and normal neuro exam.  Patient can  walk but states is painful.  No loss of bowel or bladder control.  No concern for cauda equina.  No fever, night sweats, weight loss, h/o cancer, IVDU.  Xrays reviewed by me revealed no acute abnormalities. RICE protocol and pain medicine indicated and discussed with patient. Instructed pt [facility] days if symptoms do not improve. Instructed pt to take OTC Prilosec for one week while taking steroids with her history of GERD. Instructed pt to not take any tylenol while taking Norco. Pt also discharged with flexeril and instructed to only take this qhs.  Pt with chronic posterior bilateral knee pain reproducible on exam with palpation of the hamstrings. No history of DVT, no swelling, no recent travel, no hormone use, and bilateral. Less concerning for DVT. Likely 2/2 muscle tightness. Discussed stretching. Instructed pt to f/u or return to the ED with signs of DVT or PE.   Discussed strict return precautions. Pt expressed understanding to the discharge instructions.   Pt case discussed at pt seen by Dr. Madilyn Hookees who agrees with the above plan.    Final Clinical Impressions(s) / ED Diagnoses   Final diagnoses:  Bilateral sciatica  Bilateral low back pain with sciatica, sciatica laterality unspecified    New Prescriptions New Prescriptions   CYCLOBENZAPRINE (FLEXERIL) 10 MG TABLET    Take 1 tablet (10 mg total) by mouth at bedtime.   HYDROCODONE-ACETAMINOPHEN (NORCO/VICODIN) 5-325 MG TABLET    Take 1 tablet by mouth every 6 (six) hours as needed.   PREDNISONE (DELTASONE) 10 MG TABLET    Take 4 tablets (40 mg total) by mouth daily.     Jerre SimonJessica L Holley Wirt, PA 12/24/15 2110    Tilden FossaElizabeth Rees, MD 12/25/15 [id number]

## 2015-12-24 NOTE — Discharge Instructions (Signed)
Take the prednisone and Flexeril as prescribed. The sure to eat before you take the prednisone as it can be very hard on your stomach. Take over-the-counter Prilosec for the next week, once daily in the morning before breakfast. Take the Norco at night as needed for pain. Do not take additional Tylenol when you take this medication. Follow-up with [facility] in 3 days of your symptoms are not improving.  Return to the emergency department if you experience worsening pain, worsening tingling, fever, chills, nausea, vomiting, weakness or any other concerning symptoms.

## 2016-03-22 ENCOUNTER — Other Ambulatory Visit: Payer: Self-pay | Admitting: Obstetrics & Gynecology

## 2016-03-22 ENCOUNTER — Other Ambulatory Visit (HOSPITAL_COMMUNITY): Payer: Self-pay | Admitting: Obstetrics & Gynecology

## 2016-03-22 DIAGNOSIS — R102 Pelvic and perineal pain: Secondary | ICD-10-CM

## 2016-03-29 ENCOUNTER — Ambulatory Visit (HOSPITAL_COMMUNITY): Payer: BLUE CROSS/BLUE SHIELD

## 2016-03-30 ENCOUNTER — Ambulatory Visit (HOSPITAL_COMMUNITY): Payer: BLUE CROSS/BLUE SHIELD

## 2016-04-02 ENCOUNTER — Ambulatory Visit (HOSPITAL_COMMUNITY)
Admission: RE | Admit: 2016-04-02 | Discharge: 2016-04-02 | Disposition: A | Discharge status: Home / Self Care | Payer: BLUE CROSS/BLUE SHIELD | Source: Ambulatory Visit | Attending: Obstetrics & Gynecology | Admitting: Obstetrics & Gynecology

## 2016-04-02 ENCOUNTER — Encounter (INDEPENDENT_AMBULATORY_CARE_PROVIDER_SITE_OTHER): Payer: Self-pay

## 2016-04-02 DIAGNOSIS — K5730 Diverticulosis of large intestine without perforation or abscess without bleeding: Secondary | ICD-10-CM | POA: Insufficient documentation

## 2016-04-02 DIAGNOSIS — R102 Pelvic and perineal pain: Secondary | ICD-10-CM | POA: Diagnosis not present

## 2016-04-02 DIAGNOSIS — Z9049 Acquired absence of other specified parts of digestive tract: Secondary | ICD-10-CM | POA: Diagnosis not present

## 2016-04-02 DIAGNOSIS — N854 Malposition of uterus: Secondary | ICD-10-CM | POA: Diagnosis not present

## 2016-04-02 MED ORDER — IOPAMIDOL (ISOVUE-300) INJECTION 61%
100.0000 mL | Freq: Once | INTRAVENOUS | Status: AC | PRN
  Administered 2016-04-02: 100 mL via INTRAVENOUS

## 2016-06-25 ENCOUNTER — Encounter (HOSPITAL_COMMUNITY): Payer: Self-pay | Admitting: Family Medicine

## 2016-06-25 ENCOUNTER — Ambulatory Visit (HOSPITAL_COMMUNITY)
Admission: EM | Admit: 2016-06-25 | Discharge: 2016-06-25 | Disposition: A | Discharge status: Home / Self Care | Payer: BLUE CROSS/BLUE SHIELD | Attending: Family Medicine | Admitting: Family Medicine

## 2016-06-25 DIAGNOSIS — J40 Bronchitis, not specified as acute or chronic: Secondary | ICD-10-CM | POA: Diagnosis not present

## 2016-06-25 DIAGNOSIS — R059 Cough, unspecified: Secondary | ICD-10-CM

## 2016-06-25 DIAGNOSIS — R05 Cough: Secondary | ICD-10-CM

## 2016-06-25 MED ORDER — BENZONATATE 100 MG PO CAPS: 100.0000 mg | ORAL_CAPSULE | Freq: Three times a day (TID) | ORAL | 0 refills | Status: DC

## 2016-06-25 MED ORDER — AZITHROMYCIN 250 MG PO TABS: 250.0000 mg | ORAL_TABLET | Freq: Every day | ORAL | 0 refills | Status: DC

## 2016-06-25 MED ORDER — METHYLPREDNISOLONE 4 MG PO TBPK: ORAL_TABLET | ORAL | 0 refills | Status: DC

## 2016-06-25 MED ORDER — ALBUTEROL SULFATE HFA 108 (90 BASE) MCG/ACT IN AERS: 1.0000 | INHALATION_SPRAY | Freq: Four times a day (QID) | RESPIRATORY_TRACT | 0 refills | Status: DC | PRN

## 2016-06-25 NOTE — ED Triage Notes (Signed)
Pt here for productive cough and nasal congestion.

## 2016-06-25 NOTE — ED Provider Notes (Signed)
CSN: [id number]     Arrival date & time 06/25/16  1008 History   None    Chief Complaint  Patient presents with  . Cough  . Nasal Congestion   (Consider location/radiation/quality/duration/timing/severity/associated sxs/prior Treatment) Patient c/o productive cough and nasal congestion for a week.   The history is provided by the patient.  Cough  Cough characteristics:  Productive Sputum characteristics:  Green Severity:  Moderate Onset quality:  Sudden Duration:  1 week Timing:  Intermittent Progression:  Worsening Chronicity:  New Smoker: no   Context: upper respiratory infection and weather changes   Relieved by:  Nothing Worsened by:  Deep breathing Ineffective treatments:  None tried Associated symptoms: sinus congestion     Past Medical History:  Diagnosis Date  . Ectopic pregnancy   . Fibromyalgia    'had it before' no problems in 5-8530yrs  . GERD (gastroesophageal reflux disease)   . Headache(784.0)   . Indigestion   . Infection    UTI  . Pregnancy induced hypertension    with first   Past Surgical History:  Procedure Laterality Date  . CARDIAC CATHETERIZATION     not a cardiac problem- really bad acid reflex  . CARDIAC CATHETERIZATION  2006  . CESAREAN SECTION    . CHOLECYSTECTOMY N/A 10/11/2013   Procedure: LAPAROSCOPIC CHOLECYSTECTOMY;  Surgeon: Shelly Rubensteinouglas A Blackman, MD;  Location: [facility];  Service: General;  Laterality: N/A;  . THERAPEUTIC ABORTION     Family History  Problem Relation Age of Onset  . Diabetes Maternal Aunt   . Cancer Maternal Aunt     mother's aunt Br Ca  . Diabetes Maternal Grandmother   . Hypertension Maternal Grandmother   . Heart disease Maternal Grandmother   . Other Neg Hx    Social History  Substance Use Topics  . Smoking status: Former Games developermoker  . Smokeless tobacco: Never Used     Comment: quit 2009  . Alcohol use No   OB History    Gravida Para Term Preterm AB Living   7 3 3   4 3    SAB TAB Ectopic Multiple Live  Births   2 1 1   3      Review of Systems  Constitutional: Positive for fatigue.  HENT: Negative.   Eyes: Negative.   Respiratory: Positive for cough.   Cardiovascular: Negative.   Gastrointestinal: Negative.   Endocrine: Negative.   Genitourinary: Negative.   Musculoskeletal: Negative.   Allergic/Immunologic: Negative.   Neurological: Negative.   Hematological: Negative.   Psychiatric/Behavioral: Negative.     Allergies  Latex and Tomato  Home Medications   Prior to Admission medications   Medication Sig Start Date End Date Taking? Authorizing Provider  albuterol (PROVENTIL HFA;VENTOLIN HFA) 108 (90 Base) MCG/ACT inhaler Inhale 1-2 puffs into the lungs every 6 (six) hours as needed for wheezing or shortness of breath. 06/25/16   Deatra CanterWilliam J Oxford, FNP  azithromycin (ZITHROMAX) 250 MG tablet Take 1 tablet (250 mg total) by mouth daily. Take first 2 tablets together, then 1 every day until finished. Patient not taking: Reported on 12/24/2015 08/09/15   Elson AreasLeslie K Sofia, PA-C  azithromycin (ZITHROMAX) 250 MG tablet Take 1 tablet (250 mg total) by mouth daily. Take first 2 tablets together, then 1 every day until finished. 06/25/16   Deatra CanterWilliam J Oxford, FNP  benzonatate (TESSALON) 100 MG capsule Take 1 capsule (100 mg total) by mouth every 8 (eight) hours. Patient not taking: Reported on 12/24/2015 08/09/15   Lonia SkinnerLeslie K  Sofia, PA-C  benzonatate (TESSALON) 100 MG capsule Take 1 capsule (100 mg total) by mouth every 8 (eight) hours. 06/25/16   Deatra Canter, FNP  cetirizine (ZYRTEC) 10 MG tablet Take 10 mg by mouth daily as needed for allergies.    Historical Provider, MD  cyclobenzaprine (FLEXERIL) 10 MG tablet Take 1 tablet (10 mg total) by mouth at bedtime. 12/24/15   Jerre Simon, PA  diazepam (VALIUM) 2 MG tablet Take 1 tablet (2 mg total) by mouth every 6 (six) hours as needed for muscle spasms. Patient not taking: Reported on 12/24/2015 07/25/15   Lorre Nick, MD   HYDROcodone-acetaminophen (NORCO/VICODIN) 5-325 MG tablet Take 1 tablet by mouth every 6 (six) hours as needed. 12/24/15   Jerre Simon, PA  methylPREDNISolone (MEDROL DOSEPAK) 4 MG TBPK tablet Take 6-5-4-3-2-1 po qd 06/25/16   Deatra Canter, FNP  naproxen (NAPROSYN) 375 MG tablet Take 1 tablet (375 mg total) by mouth 2 (two) times daily with a meal. Patient not taking: Reported on 12/24/2015 07/25/15   Lorre Nick, MD  predniSONE (DELTASONE) 10 MG tablet Take 4 tablets (40 mg total) by mouth daily. 12/24/15   Jerre Simon, PA   Meds Ordered and Administered this Visit  Medications - No data to display  BP 111/72   Pulse 98   Temp 98.2 F (36.8 C)   Resp 18   SpO2 100%  No data found.   Physical Exam  Constitutional: She appears well-developed and well-nourished.  HENT:  Head: Normocephalic and atraumatic.  Right Ear: External ear normal.  Left Ear: External ear normal.  Mouth/Throat: Oropharynx is clear and moist.  Eyes: Conjunctivae and EOM are normal. Pupils are equal, round, and reactive to light.  Neck: Normal range of motion. Neck supple.  Cardiovascular: Normal rate, regular rhythm and normal heart sounds.   Pulmonary/Chest: Effort normal and breath sounds normal.  Nursing note and vitals reviewed.   Urgent Care Course     Procedures (including critical care time)  Labs Review Labs Reviewed - No data to display  Imaging Review No results found.   Visual Acuity Review  Right Eye Distance:   Left Eye Distance:   Bilateral Distance:    Right Eye Near:   Left Eye Near:    Bilateral Near:         MDM   1. Bronchitis   2. Cough    Albuterol MDI Zpak Tessalon Perles Medrol dose pack as directed 4mg  #21  Push po fluids, rest, tylenol and motrin otc prn as directed for fever, arthralgias, and myalgias.  Follow up prn if sx's continue or persist.    Deatra Canter, FNP 06/25/16 1143

## 2016-07-15 ENCOUNTER — Ambulatory Visit: Payer: BLUE CROSS/BLUE SHIELD | Admitting: Adult Health

## 2016-12-21 ENCOUNTER — Emergency Department (HOSPITAL_COMMUNITY): Payer: BLUE CROSS/BLUE SHIELD

## 2016-12-21 ENCOUNTER — Encounter (HOSPITAL_COMMUNITY): Payer: Self-pay

## 2016-12-21 ENCOUNTER — Encounter (HOSPITAL_COMMUNITY): Payer: Self-pay | Admitting: Emergency Medicine

## 2016-12-21 ENCOUNTER — Emergency Department (HOSPITAL_COMMUNITY)
Admission: EM | Admit: 2016-12-21 | Discharge: 2016-12-22 | Disposition: A | Discharge status: Home / Self Care | Payer: BLUE CROSS/BLUE SHIELD | Attending: Emergency Medicine | Admitting: Emergency Medicine

## 2016-12-21 ENCOUNTER — Ambulatory Visit (HOSPITAL_COMMUNITY)
Admission: EM | Admit: 2016-12-21 | Discharge: 2016-12-21 | Disposition: A | Discharge status: Nursing Home (Includes ICF) | Payer: BLUE CROSS/BLUE SHIELD | Attending: Family Medicine | Admitting: Family Medicine

## 2016-12-21 DIAGNOSIS — M5432 Sciatica, left side: Secondary | ICD-10-CM | POA: Insufficient documentation

## 2016-12-21 DIAGNOSIS — M79605 Pain in left leg: Secondary | ICD-10-CM | POA: Diagnosis not present

## 2016-12-21 DIAGNOSIS — M79662 Pain in left lower leg: Secondary | ICD-10-CM

## 2016-12-21 DIAGNOSIS — M5442 Lumbago with sciatica, left side: Secondary | ICD-10-CM | POA: Diagnosis not present

## 2016-12-21 DIAGNOSIS — Z87891 Personal history of nicotine dependence: Secondary | ICD-10-CM | POA: Diagnosis not present

## 2016-12-21 DIAGNOSIS — M79604 Pain in right leg: Secondary | ICD-10-CM | POA: Insufficient documentation

## 2016-12-21 DIAGNOSIS — Z91040 Latex allergy status: Secondary | ICD-10-CM | POA: Insufficient documentation

## 2016-12-21 DIAGNOSIS — Z79899 Other long term (current) drug therapy: Secondary | ICD-10-CM | POA: Insufficient documentation

## 2016-12-21 DIAGNOSIS — M7989 Other specified soft tissue disorders: Secondary | ICD-10-CM | POA: Diagnosis not present

## 2016-12-21 DIAGNOSIS — R2243 Localized swelling, mass and lump, lower limb, bilateral: Secondary | ICD-10-CM | POA: Diagnosis present

## 2016-12-21 DIAGNOSIS — T7840XA Allergy, unspecified, initial encounter: Secondary | ICD-10-CM | POA: Diagnosis not present

## 2016-12-21 LAB — I-STAT CHEM 8, ED
BUN: 12 mg/dL (ref 6–20)
CREATININE: 0.6 mg/dL (ref 0.44–1.00)
Calcium, Ion: 1.13 mmol/L — ABNORMAL LOW (ref 1.15–1.40)
Chloride: 107 mmol/L (ref 101–111)
GLUCOSE: 102 mg/dL — AB (ref 65–99)
HEMATOCRIT: 36 % (ref 36.0–46.0)
HEMOGLOBIN: 12.2 g/dL (ref 12.0–15.0)
Potassium: 3.7 mmol/L (ref 3.5–5.1)
Sodium: 141 mmol/L (ref 135–145)
TCO2: 23 mmol/L (ref 0–100)

## 2016-12-21 MED ORDER — LORAZEPAM 1 MG PO TABS
1.0000 mg | ORAL_TABLET | Freq: Once | ORAL | Status: AC
  Administered 2016-12-21: 1 mg via ORAL
  Filled 2016-12-21: qty 1

## 2016-12-21 MED ORDER — ENOXAPARIN SODIUM 150 MG/ML ~~LOC~~ SOLN
140.0000 mg | Freq: Once | SUBCUTANEOUS | Status: AC
  Administered 2016-12-22: 140 mg via SUBCUTANEOUS
  Filled 2016-12-21: qty 0.93

## 2016-12-21 NOTE — ED Notes (Signed)
Pt returned from MRI °

## 2016-12-21 NOTE — Discharge Instructions (Signed)
Please go to the emergency department for evaluation, including rule out of DVT.  Avoid aleve.

## 2016-12-21 NOTE — ED Triage Notes (Signed)
Onset this morning at work took [employer].  Pt had allergic reaction, rash, swelling of legs and feet L > R, and dizziness.  Pt has had leg/feet swelling off and on x 2 months.  Pt sent here from u/c to r/o DVTs.  No tongue/throat swelling or respiratory difficulties.  Has not taken benadryl.

## 2016-12-21 NOTE — ED Triage Notes (Signed)
Today was at work, noticed feeling really hot, rash, splotchy redness, swelling of legs and feet, and feeling dizzy.  Patient noticed headache and left ear pain on arrival to work and did take [employer].  Patient has taken aleve before.  Patient continues to feel dizzy and complains of swelling of feet and legs.  Al symptoms came on suddenly this morning

## 2016-12-21 NOTE — ED Notes (Signed)
Patient transported to MRI 

## 2016-12-21 NOTE — ED Notes (Signed)
Patient transported to X-ray 

## 2016-12-21 NOTE — ED Provider Notes (Signed)
[facility]    CSN: [id number] Arrival date & time: 12/21/16  1048     History   Chief Complaint Chief Complaint  Patient presents with  . Rash    HPI [name] is a 36 y.o. female presenting for leg swelling and flushing.   She reports having a headache and left ear fullness this morning as she arrived to work for which she took [employer]. About 30 minutes passed when she noticed diffuse flushing with erythematous rash on face, chest, arms, and legs. She also felt dizzy without syncope. The rash was improving, though she noted left leg pain and swelling worse than right leg swelling and was advised to go to urgent care for evaluation. Her legs have been swelling and painful off and on for the past several months, with worse symptoms on the left side for the past few days. She required a wheelchair for mobility due to left leg pain.   She has no history of renal, liver, or cardiac disorders. 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   HPI  Past Medical History:  Diagnosis Date  . Ectopic pregnancy   . Fibromyalgia    'had it before' no problems in 5-4yrs  . GERD (gastroesophageal reflux disease)   . Headache(784.0)   . Indigestion   . Infection    UTI  . Pregnancy induced hypertension    with first    Patient Active Problem List   Diagnosis Date Noted  . Active labor 12/27/2013  . Encounter for trial of labor 12/27/2013  . NSVD (normal spontaneous vaginal delivery) 12/27/2013  . [redacted] weeks gestation of pregnancy 10/12/2013    Past Surgical History:  Procedure Laterality Date  . CARDIAC CATHETERIZATION     not a cardiac problem- really bad acid reflex  . CARDIAC CATHETERIZATION  2006  . CESAREAN SECTION    . CHOLECYSTECTOMY N/A 10/11/2013   Procedure: LAPAROSCOPIC CHOLECYSTECTOMY;  Surgeon: Shelly Rubenstein, MD;   Location: [facility];  Service: General;  Laterality: N/A;  . THERAPEUTIC ABORTION      OB History    Gravida Para Term Preterm AB Living   7 3 3   4 3    SAB TAB Ectopic Multiple Live Births   2 1 1   3        Home Medications    Prior to Admission medications   Medication Sig Start Date End Date Taking? Authorizing Provider  cetirizine (ZYRTEC) 10 MG tablet Take 10 mg by mouth daily as needed for allergies.   Yes [provider]  albuterol (PROVENTIL HFA;VENTOLIN HFA) 108 (90 Base) MCG/ACT inhaler Inhale 1-2 puffs into the lungs every 6 (six) hours as needed for wheezing or shortness of breath. 06/25/16   Deatra Canter, FNP    Family History Family History  Problem Relation Age of Onset  . Diabetes Maternal Aunt   . Cancer Maternal Aunt        mother's aunt Br Ca  . Diabetes Maternal Grandmother   . Hypertension Maternal Grandmother   . Heart disease Maternal Grandmother   . Other Neg Hx     Social History Social History  Substance Use Topics  . Smoking status: Former Games developer  . Smokeless tobacco: Never Used     Comment: quit 2009  . Alcohol use No     Allergies   Latex and Tomato  Review of Systems Review of Systems As above and no fevers.   Physical Exam Triage Vital Signs ED Triage Vitals  Enc Vitals Group     BP 12/21/16 1134 114/77     Pulse Rate 12/21/16 1134 71     Resp 12/21/16 1134 20     Temp 12/21/16 1134 99 F (37.2 C)     Temp Source 12/21/16 1134 Oral     SpO2 12/21/16 1134 100 %     Weight --      Height --      Head Circumference --      Peak Flow --      Pain Score 12/21/16 1132 5     Pain Loc --      Pain Edu? --      Excl. in GC? --    No data found.   Updated Vital Signs BP 114/77 (BP Location: Left Arm)   Pulse 71   Temp 99 F (37.2 C) (Oral)   Resp 20   SpO2 100%   Physical Exam  Constitutional: She appears well-developed and well-nourished. No distress.  HENT:  Head: Normocephalic and atraumatic.    Right Ear: External ear normal.  Left Ear: External ear normal.  Eyes: Conjunctivae are normal.  Neck: Neck supple.  Cardiovascular: Normal rate and regular rhythm.   No murmur heard. Pulmonary/Chest: Effort normal and breath sounds normal. No respiratory distress.  Abdominal: Soft. There is no tenderness.  Musculoskeletal:  Bilteral nonpitting edema to feet L > R with tenderness to calf compression and equivocal homan's sign on left.   Neurological: She is alert.  Skin: Skin is warm and dry. Capillary refill takes less than 2 seconds. No rash noted.  Psychiatric: She has a normal mood and affect.  Nursing note and vitals reviewed.   UC Treatments / Results  Labs (all labs ordered are listed, but only abnormal results are displayed) Labs Reviewed - No data to display  EKG  EKG Interpretation None       Radiology No results found.  Procedures Procedures (including critical care time)  Medications Ordered in UC Medications - No data to display   Initial Impression / Assessment and Plan / UC Course  I have reviewed the triage vital signs and the nursing notes.  Pertinent labs & imaging results that were available during my care of the patient were reviewed by me and considered in my medical decision making (see chart for details).   Final Clinical Impressions(s) / UC Diagnoses   Final diagnoses:  Pain and swelling of left lower leg  Bilateral swelling of feet  Allergic reaction, initial encounter   36 y.o. female presenting from work with transient erythematous eruption after taking aleve, suspect drug reaction. Her left foot and leg is swollen and significantly tender limiting ambulation, so I feel she would need evaluation for DVT. She has no PCP. No evidence for PE or respiratory compromise.  She is directed to the ED for evaluation in stable condition. She may also benefit from CBC, CMP.   Tyrone Nine, MD 12/21/16 [phone]

## 2016-12-21 NOTE — ED Provider Notes (Signed)
[facility]   CSN: [id number] Arrival date & time: 12/21/16  1418     History   Chief Complaint Chief Complaint  Patient presents with  . Leg Swelling    HPI [name] is a 36 y.o. female.  Patient presents emergency department with chief complaint of bilateral lower extremity pain and swelling. She also reports low back pain. She states that the symptoms have been gradually worsening over the past several weeks. She also reports that she has more swelling in her left leg and in her right. She states that she felt dizzy after taking some naproxen earlier today, but no longer has any of these symptoms. She denies seeing any rash, knee difficulty breathing, difficulty swallowing, wheezing, nausea, vomiting, or diarrhea. She was initially seen in urgent care, and was sent to the emergency department for a DVT study.   The history is provided by the patient. No language interpreter was used.    Past Medical History:  Diagnosis Date  . Ectopic pregnancy   . Fibromyalgia    'had it before' no problems in 5-60yrs  . GERD (gastroesophageal reflux disease)   . Headache(784.0)   . Indigestion   . Infection    UTI  . Pregnancy induced hypertension    with first    Patient Active Problem List   Diagnosis Date Noted  . Active labor 12/27/2013  . Encounter for trial of labor 12/27/2013  . NSVD (normal spontaneous vaginal delivery) 12/27/2013  . [redacted] weeks gestation of pregnancy 10/12/2013    Past Surgical History:  Procedure Laterality Date  . CARDIAC CATHETERIZATION     not a cardiac problem- really bad acid reflex  . CARDIAC CATHETERIZATION  2006  . CESAREAN SECTION    . CHOLECYSTECTOMY N/A 10/11/2013   Procedure: LAPAROSCOPIC CHOLECYSTECTOMY;  Surgeon: Shelly Rubenstein, MD;  Location: [facility];  Service: General;  Laterality: N/A;  . THERAPEUTIC ABORTION      OB History    Gravida Para Term Preterm AB Living   7 3 3   4 3    SAB TAB Ectopic  Multiple Live Births   2 1 1   3        Home Medications    Prior to Admission medications   Medication Sig Start Date End Date Taking? Authorizing Provider  albuterol (PROVENTIL HFA;VENTOLIN HFA) 108 (90 Base) MCG/ACT inhaler Inhale 1-2 puffs into the lungs every 6 (six) hours as needed for wheezing or shortness of breath. 06/25/16   Deatra Canter, FNP  cetirizine (ZYRTEC) 10 MG tablet Take 10 mg by mouth daily as needed for allergies.    [provider]    Family History Family History  Problem Relation Age of Onset  . Diabetes Maternal Aunt   . Cancer Maternal Aunt        mother's aunt Br Ca  . Diabetes Maternal Grandmother   . Hypertension Maternal Grandmother   . Heart disease Maternal Grandmother   . Other Neg Hx     Social History Social History  Substance Use Topics  . Smoking status: Former Games developer  . Smokeless tobacco: Never Used     Comment: quit 2009  . Alcohol use No     Allergies   Aleve [naproxen sodium]; Latex; and Tomato   Review of Systems Review of Systems  Constitutional: Negative for chills and fever.  Gastrointestinal:       No bowel incontinence  Genitourinary:       No  urinary incontinence  Musculoskeletal: Positive for arthralgias, back pain and myalgias.  Neurological:       No saddle anesthesia  All other systems reviewed and are negative.    Physical Exam Updated Vital Signs BP 109/71 (BP Location: Right Arm)   Pulse 92   Temp 98.1 F (36.7 C) (Oral)   Resp 18   LMP 11/29/2016   SpO2 100%   Physical Exam Physical Exam  Constitutional: Pt appears well-developed and well-nourished. No distress.  HENT:  Head: Normocephalic and atraumatic.  Mouth/Throat: Oropharynx is clear and moist. No oropharyngeal exudate.  Eyes: Conjunctivae are normal.  Neck: Normal range of motion. Neck supple.  No meningismus Cardiovascular: Normal rate, regular rhythm and intact distal pulses.   Pulmonary/Chest: Effort normal and  breath sounds normal. No respiratory distress. Pt has no wheezes.  Abdominal: Pt exhibits no distension Musculoskeletal:  Lumbar paraspinal muscles tender to palpation, no bony CTLS spine tenderness, deformity, step-off, or crepitus Lymphadenopathy: Pt has no cervical adenopathy.  Neurological: Pt is alert and oriented Speech is clear and goal oriented, follows commands Normal 5/5 strength in upper and lower extremities bilaterally including dorsiflexion and plantar flexion, strong and equal grip strength Sensation intact Great toe extension intact Moves extremities without ataxia, coordination intact Ankle and knee jerk reflexes intact and symmetrical  Normal gait Normal balance No Clonus Skin: Skin is warm and dry. No rash noted. Pt is not diaphoretic. No erythema.  Psychiatric: Pt has a normal mood and affect. Behavior is normal.  Nursing note and vitals reviewed.   ED Treatments / Results  Labs (all labs ordered are listed, but only abnormal results are displayed) Labs Reviewed  CBC  BASIC METABOLIC PANEL    EKG  EKG Interpretation None       Radiology No results found.  Procedures Procedures (including critical care time)  Medications Ordered in ED Medications  LORazepam (ATIVAN) tablet 1 mg (1 mg Oral Given 12/21/16 2126)     Initial Impression / Assessment and Plan / ED Course  I have reviewed the triage vital signs and the nursing notes.  Pertinent labs & imaging results that were available during my care of the patient were reviewed by me and considered in my medical decision making (see chart for details).     Patient sent for DVT study given bilateral lower extremity swelling. There is no pitting edema on bilateral lower extremities. She has no chest pain or shortness breath. I doubt CHF. Chest x-ray ordered per patient request, this is normal. Patient's symptoms are thought not to be DVT, as there is no visible swelling, however due to the late hour,  unable to obtain ultrasound, will have the patient return in the morning for ultrasound. Additionally, the patient has had significant low back pain with sciatica type symptoms. She states the pain is so bad that she cannot walk. She denies any bowel or bladder incontinence. She does seem to have some decreased strength on exam, but this may be secondary to pain and decreased effort. Nevertheless, will check MRI of lumbar spine to rule out any cord impingement.  MRI is negative for impingement. She does have some facet arthrosis, which could be causing the patient's pain. I have advised her to follow-up with orthopedics. She states that she will do so. She understands the plan to return tomorrow for her ultrasound. She is stable and ready for discharge.  Final Clinical Impressions(s) / ED Diagnoses   Final diagnoses:  Sciatica of left side  Pain in both lower extremities  Acute bilateral low back pain with left-sided sciatica    New Prescriptions New Prescriptions   No medications on file     Roxy Horseman, Cordelia Poche 12/22/16 0003    Loren Racer, MD 12/24/16 [phone]

## 2016-12-22 ENCOUNTER — Ambulatory Visit (HOSPITAL_BASED_OUTPATIENT_CLINIC_OR_DEPARTMENT_OTHER)
Admission: RE | Admit: 2016-12-22 | Discharge: 2016-12-22 | Disposition: A | Discharge status: Home / Self Care | Payer: BLUE CROSS/BLUE SHIELD | Source: Ambulatory Visit | Attending: Emergency Medicine | Admitting: Emergency Medicine

## 2016-12-22 DIAGNOSIS — M7989 Other specified soft tissue disorders: Secondary | ICD-10-CM

## 2016-12-22 NOTE — Progress Notes (Signed)
*  PRELIMINARY RESULTS* Vascular Ultrasound Bilateral lower extremity venous duplex has been completed.  Preliminary findings: No evidence of deep vein thrombosis or baker's cyst in the bilateral lower extremities.   [name] [name] 12/22/2016, 9:00 AM

## 2016-12-24 ENCOUNTER — Ambulatory Visit
Admission: RE | Admit: 2016-12-24 | Discharge: 2016-12-24 | Disposition: A | Discharge status: Home / Self Care | Payer: BLUE CROSS/BLUE SHIELD | Source: Ambulatory Visit | Attending: Family Medicine | Admitting: Family Medicine

## 2016-12-24 ENCOUNTER — Other Ambulatory Visit: Payer: Self-pay | Admitting: Family Medicine

## 2016-12-24 DIAGNOSIS — M545 Low back pain: Secondary | ICD-10-CM

## 2016-12-24 DIAGNOSIS — M542 Cervicalgia: Secondary | ICD-10-CM

## 2016-12-24 DIAGNOSIS — M546 Pain in thoracic spine: Secondary | ICD-10-CM

## 2017-02-16 ENCOUNTER — Ambulatory Visit (INDEPENDENT_AMBULATORY_CARE_PROVIDER_SITE_OTHER): Payer: BLUE CROSS/BLUE SHIELD | Admitting: Neurology

## 2017-02-16 ENCOUNTER — Encounter: Payer: Self-pay | Admitting: Neurology

## 2017-02-16 ENCOUNTER — Ambulatory Visit (INDEPENDENT_AMBULATORY_CARE_PROVIDER_SITE_OTHER): Payer: Self-pay | Admitting: Neurology

## 2017-02-16 DIAGNOSIS — M79605 Pain in left leg: Secondary | ICD-10-CM | POA: Diagnosis not present

## 2017-02-16 DIAGNOSIS — M79604 Pain in right leg: Secondary | ICD-10-CM | POA: Diagnosis not present

## 2017-02-16 NOTE — Procedures (Signed)
     HISTORY:  [name] is a 36 year old patient with a several month history of lower extremity discomfort, left greater than right. The patient has pain in the base of the spine and pain going down the back of the legs to the feet. The patient is being evaluated for a neuropathy or a possible lumbosacral radiculopathy.  NERVE CONDUCTION STUDIES:  Nerve conduction studies were performed on both lower extremities. The distal motor latencies and motor amplitudes for the peroneal and posterior tibial nerves were within normal limits. The nerve conduction velocities for these nerves were also normal. The H reflex latencies were normal. The sensory latencies for the peroneal nerves were within normal limits.   EMG STUDIES:  EMG study was performed on the right lower extremity:  The tibialis anterior muscle reveals 2 to 4K motor units with full recruitment. No fibrillations or positive waves were seen. The peroneus tertius muscle reveals 2 to 4K motor units with full recruitment. No fibrillations or positive waves were seen. The medial gastrocnemius muscle reveals 1 to 3K motor units with full recruitment. No fibrillations or positive waves were seen. The vastus lateralis muscle reveals 2 to 4K motor units with full recruitment. No fibrillations or positive waves were seen. The iliopsoas muscle reveals 2 to 4K motor units with full recruitment. No fibrillations or positive waves were seen. The biceps femoris muscle (long head) reveals 2 to 4K motor units with full recruitment. No fibrillations or positive waves were seen. The lumbosacral paraspinal muscles were tested at 3 levels, and revealed no abnormalities of insertional activity at all 3 levels tested. There was good relaxation.  EMG study was performed on the left lower extremity:  The tibialis anterior muscle reveals 2 to 4K motor units with full recruitment. No fibrillations or positive waves were seen. The peroneus tertius muscle  reveals 2 to 4K motor units with full recruitment. No fibrillations or positive waves were seen. The medial gastrocnemius muscle reveals 1 to 3K motor units with full recruitment. No fibrillations or positive waves were seen. The vastus lateralis muscle reveals 2 to 4K motor units with full recruitment. No fibrillations or positive waves were seen. The iliopsoas muscle reveals 2 to 4K motor units with full recruitment. No fibrillations or positive waves were seen. The biceps femoris muscle (long head) reveals 2 to 4K motor units with full recruitment. No fibrillations or positive waves were seen. The lumbosacral paraspinal muscles were tested at 3 levels, and revealed no abnormalities of insertional activity at all 3 levels tested. There was good relaxation.   IMPRESSION:  Nerve conduction studies done on both lower extremities were within normal limits. No evidence of a neuropathy was seen. EMG evaluation of both lower extremities were unremarkable, there is no evidence of an overlying lumbosacral radiculopathy on either side.  Marlan Palau. Keith Natasha Burda MD 02/16/2017 4:33 PM  [facility] [address] [place], [place] [id number]  Phone [phone] Fax [phone]

## 2017-02-16 NOTE — Progress Notes (Signed)
Please refer EMG and nerve conduction study procedure note. 

## 2017-02-17 NOTE — Progress Notes (Signed)
   [facility]    Nerve / Sites Muscle Latency Ref. Amplitude Ref. Rel Amp Segments Distance Velocity Ref. Area    ms ms mV mV %  cm m/s m/s mVms  L Peroneal - EDB     Ankle EDB 3.1 ?6.5 3.4 ?2.0 100 Ankle - EDB 9   8.1     Fib head EDB 9.6  2.4  71.6 Fib head - Ankle 37 57 ?44 5.7     Pop fossa EDB 11.3  2.7  109 Pop fossa - Fib head 10 60 ?44 6.3         Pop fossa - Ankle      R Peroneal - EDB     Ankle EDB 2.9 ?6.5 3.9 ?2.0 100 Ankle - EDB 9   10.2     Fib head EDB 9.3  3.0  77.5 Fib head - Ankle 39 60 ?44 7.9     Pop fossa EDB 10.2  2.5  82.2 Pop fossa - Fib head 10 120 ?44 6.7         Pop fossa - Ankle      L Tibial - AH     Ankle AH 3.7 ?5.8 10.2 ?4.0 100 Ankle - AH 9   24.8     Pop fossa AH 10.4  9.0  88.3 Pop fossa - Ankle 42 63 ?41 26.4  R Tibial - AH     Ankle AH 3.4 ?5.8 10.7 ?4.0 100 Ankle - AH 9   21.6     Pop fossa AH 11.3  10.7  101 Pop fossa - Ankle 43 55 ?41 23.2             [facility]    Nerve / Sites Rec. Site Peak Lat Ref.  Amp Ref. Segments Distance    ms ms V V  cm  R Superficial peroneal - Ankle     Lat leg Ankle 1.8 ?4.4 10 ?6 Lat leg - Ankle 14  L Superficial peroneal - Ankle     Lat leg Ankle 2.0 ?4.4 22 ?6 Lat leg - Ankle 14         H Reflex    Nerve H Lat Lat Hmax   ms ms   Left Right Ref. Left Right Ref.  Tibial - [place] 28.0 25.7 ?35.0 28.3 29.7 ?35.0

## 2017-02-21 ENCOUNTER — Encounter: Payer: Self-pay | Admitting: Neurology

## 2017-02-21 ENCOUNTER — Ambulatory Visit (INDEPENDENT_AMBULATORY_CARE_PROVIDER_SITE_OTHER): Payer: BLUE CROSS/BLUE SHIELD | Admitting: Neurology

## 2017-02-21 DIAGNOSIS — G8929 Other chronic pain: Secondary | ICD-10-CM

## 2017-02-21 DIAGNOSIS — M5441 Lumbago with sciatica, right side: Secondary | ICD-10-CM | POA: Diagnosis not present

## 2017-02-21 DIAGNOSIS — M5442 Lumbago with sciatica, left side: Secondary | ICD-10-CM | POA: Diagnosis not present

## 2017-02-21 HISTORY — DX: Other chronic pain: G89.29

## 2017-02-21 MED ORDER — ALPRAZOLAM 0.5 MG PO TABS: ORAL_TABLET | ORAL | 0 refills | Status: DC

## 2017-02-21 MED ORDER — GABAPENTIN 100 MG PO CAPS: 100.0000 mg | ORAL_CAPSULE | Freq: Three times a day (TID) | ORAL | 3 refills | Status: DC

## 2017-02-21 NOTE — Patient Instructions (Signed)
   We will start gabapentin on a scheduled basis taking 100 mg three times a day.  Neurontin (gabapentin) may result in drowsiness, ankle swelling, gait instability, or possibly dizziness. Please contact our office if significant side effects occur with this medication.   We will get MRI of the mid-back.

## 2017-02-21 NOTE — Progress Notes (Signed)
Faxed printed[place] [facility] [place] [place] at [phone]. Received fax confirmation.
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
## 2017-03-13 DIAGNOSIS — Z87891 Personal history of nicotine dependence: Secondary | ICD-10-CM | POA: Diagnosis not present

## 2017-03-13 DIAGNOSIS — Y939 Activity, unspecified: Secondary | ICD-10-CM | POA: Diagnosis not present

## 2017-03-13 DIAGNOSIS — S79911A Unspecified injury of right hip, initial encounter: Secondary | ICD-10-CM | POA: Insufficient documentation

## 2017-03-13 DIAGNOSIS — Y929 Unspecified place or not applicable: Secondary | ICD-10-CM | POA: Insufficient documentation

## 2017-03-13 DIAGNOSIS — Z91040 Latex allergy status: Secondary | ICD-10-CM | POA: Diagnosis not present

## 2017-03-13 DIAGNOSIS — Y999 Unspecified external cause status: Secondary | ICD-10-CM | POA: Diagnosis not present

## 2017-03-13 DIAGNOSIS — Z79899 Other long term (current) drug therapy: Secondary | ICD-10-CM | POA: Insufficient documentation

## 2017-03-13 DIAGNOSIS — W010XXA Fall on same level from slipping, tripping and stumbling without subsequent striking against object, initial encounter: Secondary | ICD-10-CM | POA: Diagnosis not present

## 2017-03-14 ENCOUNTER — Encounter (HOSPITAL_COMMUNITY): Payer: Self-pay | Admitting: *Deleted

## 2017-03-14 ENCOUNTER — Other Ambulatory Visit: Payer: BLUE CROSS/BLUE SHIELD

## 2017-03-14 ENCOUNTER — Emergency Department (HOSPITAL_COMMUNITY)
Admission: EM | Admit: 2017-03-14 | Discharge: 2017-03-14 | Disposition: A | Discharge status: Home / Self Care | Payer: BLUE CROSS/BLUE SHIELD | Attending: Emergency Medicine | Admitting: Emergency Medicine

## 2017-03-14 ENCOUNTER — Other Ambulatory Visit: Payer: Self-pay

## 2017-03-14 ENCOUNTER — Emergency Department (HOSPITAL_COMMUNITY): Payer: BLUE CROSS/BLUE SHIELD

## 2017-03-14 DIAGNOSIS — S79911A Unspecified injury of right hip, initial encounter: Secondary | ICD-10-CM

## 2017-03-14 DIAGNOSIS — W010XXA Fall on same level from slipping, tripping and stumbling without subsequent striking against object, initial encounter: Secondary | ICD-10-CM

## 2017-03-14 LAB — POC URINE PREG, ED: Preg Test, Ur: NEGATIVE

## 2017-03-14 MED ORDER — OXYCODONE-ACETAMINOPHEN 5-325 MG PO TABS
2.0000 | ORAL_TABLET | Freq: Once | ORAL | Status: AC
  Administered 2017-03-14: 2 via ORAL
  Filled 2017-03-14: qty 2

## 2017-03-14 MED ORDER — OXYCODONE-ACETAMINOPHEN 5-325 MG PO TABS
1.0000 | ORAL_TABLET | ORAL | 0 refills | Status: DC | PRN
Start: 2017-03-14 — End: 2020-09-17

## 2017-03-14 NOTE — ED Triage Notes (Signed)
Pt stated "I slipped on a piece of paper and fell hurting my right hip.  The pain runs down my leg.  I took a flexeril and a norco I had at home but it hasn't helped.  I also have taken ibuprofen."

## 2017-03-14 NOTE — ED Provider Notes (Signed)
[facility]-EMERGENCY DEPT Provider Note: Lowella DellJ. Lane Kartel Wolbert, MD, FACEP  CSN: [id number] MRN: [id number] ARRIVAL: 03/13/17 at 2351 ROOM: WA16/WA16   CHIEF COMPLAINT  Hip Injury (right)   HISTORY OF PRESENT ILLNESS  03/14/17 12:52 AM [name] is a 36 y.o. female with a history of chronic bilateral back pain with sciatica.  She is here after slipping on a piece of paper yesterday afternoon about 1 PM.  She fell onto her right hip.  She has subsequently developed 7 out of 10 pain in her right hip, worse with attempted weightbearing or ambulation.  She denies numbness or weakness in the right leg but the pain does radiate down the back of her right thigh.  She states it is different than her usual sciatica.  She has taken ibuprofen, hydrocodone and Flexeril without relief.  She has applied ice and heat without relief.  She denies other injury.  Consult[place] with [place] [facility] [place] substances [place] reveals the patient has received 2 prescriptions for tramadol and 2 prescriptions for hydrocodone in the past year..   Past Medical History:  Diagnosis Date  . 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 Ectopic pregnancy   . Fibromyalgia    'had it before' no problems in 5-5228yrs  . GERD (gastroesophageal reflux disease)   . Headache(784.0)   . Indigestion   . Infection    UTI  . Pregnancy induced hypertension    with first    Past Surgical History:  Procedure Laterality Date  . CARDIAC CATHETERIZATION     not a cardiac problem- really bad acid reflex  . CARDIAC CATHETERIZATION  2006  . CESAREAN SECTION    . THERAPEUTIC ABORTION      Family History  Problem Relation Age of Onset  . Diabetes Maternal Aunt   . Cancer Maternal Aunt        mother's aunt Br Ca  . Diabetes Maternal Grandmother   . Hypertension Maternal Grandmother   . Heart disease Maternal Grandmother   . Other Neg Hx     Social History   Tobacco Use  . Smoking status: Former  Games developermoker  . Smokeless tobacco: Never Used  . Tobacco comment: quit 2009  Substance Use Topics  . Alcohol use: No  . Drug use: No    Prior to Admission medications   Medication Sig Start Date End Date Taking? Authorizing Provider  ALPRAZolam Prudy Feeler(XANAX) 0.5 MG tablet Take 2 tablets approximately 45 minutes prior to the MRI study, take a third tablet if needed. 02/21/17   York SpanielWillis, Charles K, MD  cetirizine (ZYRTEC) 10 MG tablet Take 10 mg by mouth daily as needed for allergies.    [provider]  gabapentin (NEURONTIN) 100 MG capsule Take 1 capsule (100 mg total) by mouth 3 (three) times daily. 02/21/17   York SpanielWillis, Charles K, MD    Allergies Aleve [naproxen sodium]; Latex; and Tomato   REVIEW OF SYSTEMS  Negative except as noted here or in the History of Present Illness.   PHYSICAL EXAMINATION  Initial Vital Signs Blood pressure 127/71, pulse 82, temperature 98.1 F (36.7 C), temperature source Oral, resp. rate 16, height 4\' 11"  (1.499 m), weight 90.7 kg (200 lb), last menstrual period 03/14/2017, SpO2 99 %.  Examination General: Well-developed, well-nourished female in no acute distress; appearance consistent with age of record HENT: normocephalic; atraumatic Eyes: pupils equal, round and reactive to light; extraocular muscles intact Neck: supple; no C-spine tenderness Heart: regular rate and rhythm  Lungs: clear to auscultation bilaterally Abdomen: soft; nondistended; nontender; bowel sounds present Extremities: No deformity; decreased range of motion right hip due to pain; tenderness of right hip medial to the greater trochanter; pulses normal Neurologic: Awake, alert and oriented; motor function intact in all extremities and symmetric except limited exam of right lower extremity due to pain; sensation intact in lower extremities and symmetric; no facial droop Skin: Warm and dry Psychiatric: Normal mood and affect   RESULTS  Summary of this visit's results, reviewed by  myself:   EKG Interpretation  Date/Time:    Ventricular Rate:    PR Interval:    QRS Duration:   QT Interval:    QTC Calculation:   R Axis:     Text Interpretation:        Laboratory Studies: Results for orders placed or performed during the hospital encounter of 03/14/17 (from the past 24 hour(s))  POC Urine Pregnancy, ED ([facility] NOT [facility])     Status: None   Collection Time: 03/14/17  1:24 AM  Result Value Ref Range   Preg Test, Ur NEGATIVE NEGATIVE   Imaging Studies: Dg Hip Unilat  With Pelvis 2-3 Views Right  Result Date: 03/14/2017 CLINICAL DATA:  Fall, RIGHT hip pain. EXAM: DG HIP (WITH OR WITHOUT PELVIS) 2-3V RIGHT COMPARISON:  None. FINDINGS: There is no evidence of hip fracture or dislocation. There is no evidence of arthropathy or other focal bone abnormality. IMPRESSION: Negative. Electronically Signed   By: Awilda Metroourtnay  Bloomer M.D.   On: 03/14/2017 01:58    ED COURSE  Nursing notes and initial vitals signs, including pulse oximetry, reviewed.  Vitals:   03/14/17 0005 03/14/17 0006  BP: 127/71   Pulse: 82   Resp: 16   Temp: 98.1 F (36.7 C)   TempSrc: Oral   SpO2: 99%   Weight:  90.7 kg (200 lb)  Height:  4\' 11"  (1.499 m)   Patient advised of negative x-rays.  She is scheduled for an MRI of her back later today but is concerned she will not be able to lie still for it.  She is a patient of Dr. Yevette Edwardsumonski and will follow up with him.  PROCEDURES    ED DIAGNOSES     ICD-10-CM   1. Fall from slip, trip, or stumble, initial encounter W01.0XXA   2. Injury of right hip, initial encounter [id number]        Paula LibraMolpus, Islam Eichinger, MD 03/14/17 [phone]

## 2017-03-29 ENCOUNTER — Ambulatory Visit: Payer: BLUE CROSS/BLUE SHIELD | Admitting: Neurology

## 2017-06-15 ENCOUNTER — Ambulatory Visit: Payer: BLUE CROSS/BLUE SHIELD | Admitting: Neurology

## 2017-07-12 ENCOUNTER — Inpatient Hospital Stay (HOSPITAL_COMMUNITY)
Admission: AD | Admit: 2017-07-12 | Discharge: 2017-07-13 | Disposition: A | Discharge status: Home / Self Care | Payer: BLUE CROSS/BLUE SHIELD | Source: Ambulatory Visit | Attending: Obstetrics and Gynecology | Admitting: Obstetrics and Gynecology

## 2017-07-12 ENCOUNTER — Encounter (HOSPITAL_COMMUNITY): Payer: Self-pay | Admitting: *Deleted

## 2017-07-12 DIAGNOSIS — R350 Frequency of micturition: Secondary | ICD-10-CM | POA: Diagnosis not present

## 2017-07-12 DIAGNOSIS — Z79899 Other long term (current) drug therapy: Secondary | ICD-10-CM | POA: Insufficient documentation

## 2017-07-12 DIAGNOSIS — Z886 Allergy status to analgesic agent status: Secondary | ICD-10-CM | POA: Diagnosis not present

## 2017-07-12 DIAGNOSIS — R300 Dysuria: Secondary | ICD-10-CM | POA: Insufficient documentation

## 2017-07-12 DIAGNOSIS — Z87891 Personal history of nicotine dependence: Secondary | ICD-10-CM | POA: Diagnosis not present

## 2017-07-12 DIAGNOSIS — M545 Low back pain: Secondary | ICD-10-CM | POA: Insufficient documentation

## 2017-07-12 DIAGNOSIS — M7918 Myalgia, other site: Secondary | ICD-10-CM | POA: Diagnosis not present

## 2017-07-12 DIAGNOSIS — Z91040 Latex allergy status: Secondary | ICD-10-CM | POA: Diagnosis not present

## 2017-07-12 LAB — CBC
HEMATOCRIT: 35.2 % — AB (ref 36.0–46.0)
HEMOGLOBIN: 11.6 g/dL — AB (ref 12.0–15.0)
MCH: 28.6 pg (ref 26.0–34.0)
MCHC: 33 g/dL (ref 30.0–36.0)
MCV: 86.7 fL (ref 78.0–100.0)
Platelets: 227 10*3/uL (ref 150–400)
RBC: 4.06 MIL/uL (ref 3.87–5.11)
RDW: 13.3 % (ref 11.5–15.5)
WBC: 8.5 10*3/uL (ref 4.0–10.5)

## 2017-07-12 LAB — URINALYSIS, ROUTINE W REFLEX MICROSCOPIC
Bilirubin Urine: NEGATIVE
GLUCOSE, UA: NEGATIVE mg/dL
Hgb urine dipstick: NEGATIVE
Ketones, ur: NEGATIVE mg/dL
LEUKOCYTES UA: NEGATIVE
NITRITE: NEGATIVE
PH: 5 (ref 5.0–8.0)
Protein, ur: NEGATIVE mg/dL
SPECIFIC GRAVITY, URINE: 1.027 (ref 1.005–1.030)

## 2017-07-12 LAB — WET PREP, GENITAL
CLUE CELLS WET PREP: NONE SEEN
SPERM: NONE SEEN
Trich, Wet Prep: NONE SEEN
Yeast Wet Prep HPF POC: NONE SEEN

## 2017-07-12 LAB — POCT PREGNANCY, URINE: Preg Test, Ur: NEGATIVE

## 2017-07-12 MED ORDER — KETOROLAC TROMETHAMINE 30 MG/ML IJ SOLN
60.0000 mg | Freq: Once | INTRAMUSCULAR | Status: AC
  Administered 2017-07-12: 60 mg via INTRAMUSCULAR
  Filled 2017-07-12: qty 2

## 2017-07-12 NOTE — MAU Provider Note (Signed)
History     CSN: [id number]  Arrival date and time: 07/12/17 2111   First Provider Initiated Contact with Patient 07/12/17 2204      Chief Complaint  Patient presents with  . Dysuria   37 y.o. non pregnant female here with LBP, flank pain, and urinary frequency. She reports sx started 4 days ago after she was getting over a stomach illness. She thought she was getting another UTI so she took some left over antibiotics she had not finished from her last UTI. Describes the pain as constant, in the lower back, pulsing, worse on left side. Had a temp of 100 today. She took Gabapentin and Norco and had some relief. Reports urinary frequency and urgency but no dysuria and hematuria. She reports UTI in Jan and Feb of this year and happens a few days after IC. Denies recent lifting or strenuous exercise.    Past Medical History:  Diagnosis Date  . 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 Ectopic pregnancy   . Fibromyalgia    'had it before' no problems in 5-32yrs  . GERD (gastroesophageal reflux disease)   . Headache(784.0)   . Indigestion   . Infection    UTI  . Pregnancy induced hypertension    with first    Past Surgical History:  Procedure Laterality Date  . CARDIAC CATHETERIZATION     not a cardiac problem- really bad acid reflex  . CARDIAC CATHETERIZATION  2006  . CESAREAN SECTION    . CHOLECYSTECTOMY N/A 10/11/2013   Procedure: LAPAROSCOPIC CHOLECYSTECTOMY;  Surgeon: Shelly Rubenstein, MD;  Location: [facility];  Service: General;  Laterality: N/A;  . THERAPEUTIC ABORTION      Family History  Problem Relation Age of Onset  . Diabetes Maternal Aunt   . Cancer Maternal Aunt        mother's aunt Br Ca  . Diabetes Maternal Grandmother   . Hypertension Maternal Grandmother   . Heart disease Maternal Grandmother   . Other Neg Hx     Social History   Tobacco Use  . Smoking status: Former Games developer  . Smokeless tobacco: Never Used  . Tobacco comment: quit  2009  Substance Use Topics  . Alcohol use: No  . Drug use: No    Allergies:  Allergies  Allergen Reactions  . Aleve [Naproxen Sodium] Swelling and Rash    Swelling legs/feet; dizziness; hot, flushed  . Latex Hives, Itching and Rash  . Tomato Itching    No medications prior to admission.    Review of Systems  Constitutional: Negative for fever.  Gastrointestinal: Negative for constipation and diarrhea.  Genitourinary: Positive for frequency and urgency. Negative for dysuria, hematuria and vaginal discharge.  Musculoskeletal: Positive for back pain.   Physical Exam   Blood pressure (!) 106/46, pulse 84, temperature 98.3 F (36.8 C), temperature source Oral, resp. rate 20, height 4\' 11"  (1.499 m), weight 209 lb (94.8 kg), last menstrual period 06/17/2017.  Physical Exam  Nursing note and vitals reviewed. Constitutional: She is oriented to person, place, and time. She appears well-developed and well-nourished. No distress.  HENT:  Head: Normocephalic and atraumatic.  Neck: Normal range of motion.  Cardiovascular: Normal rate.  Respiratory: Effort normal. No respiratory distress.  GI: Soft. She exhibits no distension and no mass. There is no tenderness. There is no rebound, no guarding and no CVA tenderness.  Musculoskeletal: Normal range of motion.       Arms: Neurological: She is alert  and oriented to person, place, and time.  Skin: Skin is warm and dry.  Psychiatric: She has a normal mood and affect.   Results for orders placed or performed during the hospital encounter of 07/12/17 (from the past 24 hour(s))  Urinalysis, Routine w reflex microscopic     Status: Abnormal   Collection Time: 07/12/17  9:38 PM  Result Value Ref Range   Color, Urine YELLOW YELLOW   APPearance HAZY (A) CLEAR   Specific Gravity, Urine 1.027 1.005 - 1.030   pH 5.0 5.0 - 8.0   Glucose, UA NEGATIVE NEGATIVE mg/dL   Hgb urine dipstick NEGATIVE NEGATIVE   Bilirubin Urine NEGATIVE NEGATIVE    Ketones, ur NEGATIVE NEGATIVE mg/dL   Protein, ur NEGATIVE NEGATIVE mg/dL   Nitrite NEGATIVE NEGATIVE   Leukocytes, UA NEGATIVE NEGATIVE  Pregnancy, urine POC     Status: None   Collection Time: 07/12/17  9:45 PM  Result Value Ref Range   Preg Test, Ur NEGATIVE NEGATIVE  Wet prep, genital     Status: Abnormal   Collection Time: 07/12/17 10:13 PM  Result Value Ref Range   Yeast Wet Prep HPF POC NONE SEEN NONE SEEN   Trich, Wet Prep NONE SEEN NONE SEEN   Clue Cells Wet Prep HPF POC NONE SEEN NONE SEEN   WBC, Wet Prep HPF POC FEW (A) NONE SEEN   Sperm NONE SEEN   CBC     Status: Abnormal   Collection Time: 07/12/17 10:27 PM  Result Value Ref Range   WBC 8.5 4.0 - 10.5 K/uL   RBC 4.06 3.87 - 5.11 MIL/uL   Hemoglobin 11.6 (L) 12.0 - 15.0 g/dL   HCT 16.135.2 (L) 09.636.0 - 04.546.0 %   MCV 86.7 78.0 - 100.0 fL   MCH 28.6 26.0 - 34.0 pg   MCHC 33.0 30.0 - 36.0 g/dL   RDW 40.913.3 81.111.5 - 91.415.5 %   Platelets 227 150 - 400 K/uL   MAU Course  Procedures Toradol  MDM Labs ordered and reviewed. Pain improved after med. No evidence of UTI, pyelo, or acute abdominal process. Will send UC. Pain may be MSK from previously vomiting. Presentation, clinical findings, and plan discussed with Dr. Tenny Crawoss. Stable for discharge home.  Assessment and Plan   1. Musculoskeletal pain   2. Urinary frequency    Discharge home Follow up with Dr. Tenny Crawoss prn Ibuprofen 600 mg q6 hrs Heat to affected area Return to MAU for OBGYN emergencies  Allergies as of 07/13/2017      Reactions   Aleve [naproxen Sodium] Swelling, Rash   Swelling legs/feet; dizziness; hot, flushed   Latex Hives, Itching, Rash   Tomato Itching      Medication List    TAKE these medications   ALPRAZolam 0.5 MG tablet Commonly known as:  XANAX Take 2 tablets approximately 45 minutes prior to the MRI study, take a third tablet if needed.   cetirizine 10 MG tablet Commonly known as:  ZYRTEC Take 10 mg by mouth daily as needed for allergies.     gabapentin 100 MG capsule Commonly known as:  NEURONTIN Take 1 capsule (100 mg total) by mouth 3 (three) times daily.   oxyCODONE-acetaminophen 5-325 MG tablet Commonly known as:  PERCOCET Take 1 tablet every 4 (four) hours as needed by mouth for severe pain.      Donette LarryMelanie Vanita Cannell, CNM 07/13/2017, 1:17 AM

## 2017-07-12 NOTE — MAU Note (Signed)
Pt reporting back since Saturday, also urinary frequency and feeling hot.  Has had several UTIs in last 6 months.  Also took some left over antibiotics.

## 2017-07-12 NOTE — Discharge Instructions (Signed)
Musculoskeletal Pain Musculoskeletal pain is muscle and bone aches and pains. This pain can occur in any part of the body. Follow these instructions at home:  Only take medicines for pain, discomfort, or fever as told by your health care provider.  You may continue all activities unless the activities cause more pain. When the pain lessens, slowly resume normal activities. Gradually increase the intensity and duration of the activities or exercise.  During periods of severe pain, bed rest may be helpful. Lie or sit in any position that is comfortable, but get out of bed and walk around at least every several hours.  If directed, put ice on the injured area. ? Put ice in a plastic bag. ? Place a towel between your skin and the bag. ? Leave the ice on for 20 minutes, 2-3 times a day. Contact a health care provider if:  Your pain is getting worse.  Your pain is not relieved with medicines.  You lose function in the area of the pain if the pain is in your arms, legs, or neck. This information is not intended to replace advice given to you by your health care provider. Make sure you discuss any questions you have with your health care provider. Document Released: 04/19/2005 Document Revised: 09/30/2015 Document Reviewed: 12/22/2012 Elsevier Interactive Patient Education  2017 [employer] In late 2019, the [facility] will be moving to the [facility]. At that time, the MAU (Maternity Admissions Unit), where you are being seen today, will no longer take care of non-pregnant patients. We strongly encourage you to find a doctor's office before that time, so that you can be seen with any GYN concerns, like vaginal discharge, urinary tract infection, etc.. in a timely manner.  In order to make an office visit more convenient, the Center for [facility] will be offering evening hours with same-day appointments, walk-in appointments and scheduled appointments  available during this time.  Center for [facility] @ [facility] Hours: Monday - 8am - 7:30 pm with walk-in between 4pm- 7:30 pm Tuesday - 8 am - 5 pm (starting 08/02/17 we will be open late and accepting walk-ins from 4pm - 7:30pm) Wednesday - 8 am - 5 pm (starting 11/02/17 we will be open late and accepting walk-ins from 4pm - 7:30pm) Thursday 8 am - 5 pm (starting 02/02/18 we will be open late and accepting walk-ins from 4pm - 7:30pm) Friday 8 am - 5 pm  For an appointment please call the Center for [facility] @ [facility] at [phone]  For urgent needs, [facility] is also available for management of urgent GYN complaints such as vaginal discharge or urinary tract infections.

## 2017-07-12 NOTE — MAU Note (Addendum)
PT SAY HAD UTI IN JAN-   GOT MEDS FROM OB,     THEN HAD ANOTHER  IN FEB-  WENT TO URGENT CARE -DID NOT TAKE ALL MEDS - , HAD SEX- 3-5  THEN NOTICE S/S  ON SAT - TOOK OLD ANTX-    FEELS BACK PAIN- HAS INCREASE FREQ- . TEMP- AT WORK 99.  NO BIRTH CONTROL

## 2017-07-13 LAB — GC/CHLAMYDIA PROBE AMP (~~LOC~~) NOT AT ARMC
Chlamydia: NEGATIVE
NEISSERIA GONORRHEA: NEGATIVE

## 2017-07-14 LAB — URINE CULTURE: CULTURE: NO GROWTH

## 2018-03-22 ENCOUNTER — Telehealth: Payer: Self-pay | Admitting: Neurology

## 2018-03-22 DIAGNOSIS — M5442 Lumbago with sciatica, left side: Principal | ICD-10-CM

## 2018-03-22 DIAGNOSIS — M5441 Lumbago with sciatica, right side: Principal | ICD-10-CM

## 2018-03-22 DIAGNOSIS — G8929 Other chronic pain: Secondary | ICD-10-CM

## 2018-03-22 NOTE — Telephone Encounter (Signed)
MRI order placed as requested, [facility]

## 2018-03-22 NOTE — Telephone Encounter (Signed)
Noted, thank you

## 2018-03-22 NOTE — Telephone Encounter (Signed)
I spoke to the patient she just informed me her insurance has changed to [employer] and wanted the order to be sent back to GI... Can you put a new order in because for GI to r/s the appt they will need a new accession number with the new order.

## 2018-03-22 NOTE — Telephone Encounter (Signed)
Pt requesting a call to discuss if she can still have her MRI without having to coming in for another appt prior to scheduling.

## 2018-04-20 ENCOUNTER — Other Ambulatory Visit: Payer: Self-pay | Admitting: Neurology

## 2018-04-24 NOTE — Telephone Encounter (Signed)
Pt schedule for MRI on Thursday 12/26 called stating she will need rx for Valium [facility]

## 2018-04-27 ENCOUNTER — Other Ambulatory Visit: Payer: Self-pay | Admitting: Neurology

## 2018-04-27 ENCOUNTER — Other Ambulatory Visit: Payer: BLUE CROSS/BLUE SHIELD

## 2018-04-27 MED ORDER — ALPRAZOLAM 0.5 MG PO TABS: ORAL_TABLET | ORAL | 0 refills | Status: DC

## 2018-04-27 NOTE — Telephone Encounter (Signed)
Patient is having MRI this evening and is need of medication to take prior to the apt. Diazepam was mentioned. Will you ok patient 1-2 pills to take for MRI procedure? If so I can send it to the [facility] [place].

## 2018-04-27 NOTE — Telephone Encounter (Signed)
Patient had taken xanax in the past for MRI. I have sent the refill request to Dr Dohmeier to complete in Dr Anne HahnWillis absence with same medication he used last year.

## 2018-04-30 ENCOUNTER — Other Ambulatory Visit: Payer: Self-pay

## 2018-05-09 ENCOUNTER — Ambulatory Visit
Admission: RE | Admit: 2018-05-09 | Discharge: 2018-05-09 | Disposition: A | Discharge status: Home / Self Care | Payer: PRIVATE HEALTH INSURANCE | Source: Ambulatory Visit | Attending: Neurology | Admitting: Neurology

## 2018-05-09 ENCOUNTER — Telehealth: Payer: Self-pay | Admitting: Neurology

## 2018-05-09 DIAGNOSIS — G8929 Other chronic pain: Secondary | ICD-10-CM

## 2018-05-09 DIAGNOSIS — M5441 Lumbago with sciatica, right side: Principal | ICD-10-CM

## 2018-05-09 DIAGNOSIS — M5442 Lumbago with sciatica, left side: Principal | ICD-10-CM

## 2018-05-09 NOTE — Telephone Encounter (Signed)
Pt is called stating that she had an MRI scheduled in Dec and that a Prudy Feeler was supposed to be called in for her but it never was so she had to r/s for the MRI to be done tonight and would like the [facility]X to [facility] [address] and pt would also like to be called when the medication has been called in to the pharmacy.

## 2018-05-09 NOTE — Telephone Encounter (Signed)
Requested a call back from pt to further discuss this message.

## 2018-05-09 NOTE — Telephone Encounter (Signed)
Pt returned my call. Pt is scheduled to have a MRI completed this evening at 7 pm and is requesting a prescription for [facility] [address]  Pt was advised Dr. Vickey Huger submitted a rx for Xanax on 04/27/2018 for her to take for the MRI scheduled in dec, but the pt canceled because she did not receive the valium.  I contacted [facility] and spoke with Dorrian, I advised to cancel the rx from 04/27/2018 for the xanax.   Pt advised I would send to Dr. Anne Hahn to review and send if appropriate.  MB RN

## 2018-05-09 NOTE — Telephone Encounter (Addendum)
Per verbal order from Dr. Anne HahnWillis ok to call in Diazepam 5 mg # 2 to take PRN for MRI.   I called and provided verbal order to [employer] Shiney who verified verbal Rx.  Contacted the pt and advised [facility] [address] Pt was agreeable and had no further questions/concerns.  MB RN

## 2018-05-11 ENCOUNTER — Telehealth: Payer: Self-pay | Admitting: Neurology

## 2018-05-11 MED ORDER — PREDNISONE 5 MG PO TABS: ORAL_TABLET | ORAL | 0 refills | Status: DC

## 2018-05-11 MED ORDER — GABAPENTIN 100 MG PO CAPS: 200.0000 mg | ORAL_CAPSULE | Freq: Three times a day (TID) | ORAL | 3 refills | Status: DC

## 2018-05-11 NOTE — Telephone Encounter (Signed)
I called the patient.  MRI of the thoracic spine is unremarkable, the patient will go up on the gabapentin taking 200 mg 3 times daily, and she will be given a 6-day course of prednisone as her pain has worsened recently.  We may consider SI joint injections in the future.   MRI thoracic 05/11/18:  IMPRESSION:   Normal MRI thoracic spine (without).

## 2018-08-03 ENCOUNTER — Ambulatory Visit
Admission: EM | Admit: 2018-08-03 | Discharge: 2018-08-03 | Disposition: A | Discharge status: Home / Self Care | Payer: PRIVATE HEALTH INSURANCE

## 2018-08-03 DIAGNOSIS — B349 Viral infection, unspecified: Secondary | ICD-10-CM

## 2018-08-03 NOTE — ED Provider Notes (Addendum)
[facility]    CSN: [id number] Arrival date & time: 08/03/18  1453     History   Chief Complaint Chief Complaint  Patient presents with  . Fever    HPI [name] is a 38 y.o. female.   38 year old female comes in for fever starting last night.  States she did a tele-visit yesterday, for 4 days history of rhinorrhea, nasal congestion, sinus pressure.  She was given amoxicillin and prednisone for symptoms.  States yesterday, had T-max of 101, responsive to Tylenol.  She has body aches, cough.  She feels weak and dizzy.  She feels short of breath, as if she needs to take deep breaths.  States she has positive contact with COVID-19 patient.  Last antipyretic 3 to 4 hours ago.     Past Medical History:  Diagnosis Date  . 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 Ectopic pregnancy   . Fibromyalgia    'had it before' no problems in 5-84yrs  . GERD (gastroesophageal reflux disease)   . Headache(784.0)   . Indigestion   . Infection    UTI  . Pregnancy induced hypertension    with first    Patient Active Problem List   Diagnosis Date Noted  . 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 Bilateral leg pain 02/16/2017  . Active labor 12/27/2013  . Encounter for trial of labor 12/27/2013  . NSVD (normal spontaneous vaginal delivery) 12/27/2013  . [redacted] weeks gestation of pregnancy 10/12/2013    Past Surgical History:  Procedure Laterality Date  . CARDIAC CATHETERIZATION     not a cardiac problem- really bad acid reflex  . CARDIAC CATHETERIZATION  2006  . CESAREAN SECTION    . CHOLECYSTECTOMY N/A 10/11/2013   Procedure: LAPAROSCOPIC CHOLECYSTECTOMY;  Surgeon: Shelly Rubenstein, MD;  Location: [facility];  Service: General;  Laterality: N/A;  . THERAPEUTIC ABORTION      OB History    Gravida  7   Para  3   Term  3   Preterm      AB  4   Living  3     SAB  2   TAB  1   Ectopic  1   Multiple      Live Births   3            Home Medications    Prior to Admission medications   Medication Sig Start Date End Date Taking? Authorizing Provider  ALPRAZolam Prudy Feeler) 0.5 MG tablet Take 2 tablets approximately 45 minutes prior to the MRI study, take a third tablet if needed. 04/27/18   Dohmeier, Porfirio Mylar, MD  cetirizine (ZYRTEC) 10 MG tablet Take 10 mg by mouth daily as needed for allergies.    [provider]  gabapentin (NEURONTIN) 100 MG capsule Take 2 capsules (200 mg total) by mouth 3 (three) times daily. 05/11/18   York Spaniel, MD  oxyCODONE-acetaminophen (PERCOCET) 5-325 MG tablet Take 1 tablet every 4 (four) hours as needed by mouth for severe pain. 03/14/17   Molpus, John, MD  predniSONE (DELTASONE) 5 MG tablet Begin taking 6 tablets daily, taper by one tablet daily until off the medication. 05/11/18   York Spaniel, MD  sodium chloride (OCEAN) 0.65 % nasal spray Place 1 spray into the nose as needed for congestion. 06/02/11 07/17/11  Jimmie Molly, MD    Family History Family History  Problem Relation Age of Onset  . Diabetes Maternal  Aunt   . Cancer Maternal Aunt        mother's aunt Br Ca  . Diabetes Maternal Grandmother   . Hypertension Maternal Grandmother   . Heart disease Maternal Grandmother   . Other Neg Hx     Social History Social History   Tobacco Use  . Smoking status: Former Games developer  . Smokeless tobacco: Never Used  . Tobacco comment: quit 2009  Substance Use Topics  . Alcohol use: No  . Drug use: No     Allergies   Aleve [naproxen sodium]; Latex; and Tomato   Review of Systems Review of Systems  Reason unable to perform ROS: See HPI as above.     Physical Exam Triage Vital Signs ED Triage Vitals [08/03/18 1501]  Enc Vitals Group     BP 110/75     Pulse Rate 82     Resp 18     Temp 99.4 F (37.4 C)     Temp Source Oral     SpO2 98 %     Weight      Height      Head Circumference      Peak Flow      Pain Score 5     Pain Loc       Pain Edu?      Excl. in GC?    No data found.  Updated Vital Signs BP 110/75 (BP Location: Left Arm)   Pulse 82   Temp 99.4 F (37.4 C) (Oral)   Resp 18   SpO2 98%   Physical Exam Constitutional:      General: She is not in acute distress.    Appearance: She is well-developed. She is not ill-appearing, toxic-appearing or diaphoretic.  HENT:     Head: Normocephalic and atraumatic.  Eyes:     Conjunctiva/sclera: Conjunctivae normal.     Pupils: Pupils are equal, round, and reactive to light.  Neck:     Musculoskeletal: Normal range of motion and neck supple.  Cardiovascular:     Rate and Rhythm: Normal rate and regular rhythm.     Heart sounds: No murmur. No friction rub. No gallop.   Pulmonary:     Effort: Pulmonary effort is normal. No tachypnea, accessory muscle usage, prolonged expiration, respiratory distress or retractions.     Breath sounds: Normal breath sounds. No stridor, decreased air movement or transmitted upper airway sounds. No decreased breath sounds, wheezing, rhonchi or rales.     Comments: Patient speaking in full sentences without difficulty.  Neurological:     Mental Status: She is alert and oriented to person, place, and time.     Comments: Patient ambulating on own without difficulty. Normal gait.       UC Treatments / Results  Labs (all labs ordered are listed, but only abnormal results are displayed) Labs Reviewed - No data to display  EKG None  Radiology No results found.  Procedures Procedures (including critical care time)  Medications Ordered in UC Medications - No data to display  Initial Impression / Assessment and Plan / UC Course  I have reviewed the triage vital signs and the nursing notes.  Pertinent labs & imaging results that were available during my care of the patient were reviewed by me and considered in my medical decision making (see chart for details).    Patient speaking in full sentences without respiratory  distress. Afebrile with antipyretic in last 8 hours. No tachycardia, tachypnea. Lungs CTAB. COVID testing limited  to inpatient. Given history and exam, will have patient self quarantine. Discussed with patient can continue amoxicillin, but to discontinue prednisone given it may cause worsening in course of COVID. Instructions on when to end quarantine, family member isolation discussed, and resources provided. Symptomatic treatment discussed. Return precautions given. Patient expresses understanding and agrees to plan.  Final Clinical Impressions(s) / UC Diagnoses   Final diagnoses:  Viral illness   ED Prescriptions    None        Lurline Idol 08/03/18 1533    Linward Headland V, PA-C 08/03/18 1539

## 2018-08-03 NOTE — ED Triage Notes (Signed)
States did a tele-visit and dx'd with sinus infection. States developed fever and body aches last night. C/o weakness and dizziness

## 2018-08-03 NOTE — Discharge Instructions (Addendum)
As discussed, cannot rule out COVID. Currently, no alarming signs. I would like you to quarantine for 7 days since symptoms onset AND >72 hours without fever, and improved respiratory symptoms. Continue to monitor, you can call COVID hotline ([phone]) or use [facility] online if symptoms worsens to determine where you should seek care. If experiencing shortness of breath, trouble breathing, call 911 and provide them with your current situation.  °

## 2018-08-03 NOTE — ED Triage Notes (Signed)
Pt states has been exposed to COVID-19
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
## 2020-09-15 ENCOUNTER — Encounter (HOSPITAL_COMMUNITY): Payer: Self-pay | Admitting: *Deleted

## 2020-09-15 ENCOUNTER — Emergency Department (HOSPITAL_COMMUNITY): Payer: Self-pay

## 2020-09-15 ENCOUNTER — Other Ambulatory Visit: Payer: Self-pay

## 2020-09-15 ENCOUNTER — Emergency Department (HOSPITAL_COMMUNITY)
Admission: EM | Admit: 2020-09-15 | Discharge: 2020-09-15 | Disposition: A | Discharge status: Home / Self Care | Payer: Self-pay | Attending: Emergency Medicine | Admitting: Emergency Medicine

## 2020-09-15 DIAGNOSIS — R1030 Lower abdominal pain, unspecified: Secondary | ICD-10-CM | POA: Insufficient documentation

## 2020-09-15 DIAGNOSIS — O26891 Other specified pregnancy related conditions, first trimester: Secondary | ICD-10-CM | POA: Insufficient documentation

## 2020-09-15 DIAGNOSIS — R300 Dysuria: Secondary | ICD-10-CM | POA: Insufficient documentation

## 2020-09-15 DIAGNOSIS — Z3A01 Less than 8 weeks gestation of pregnancy: Secondary | ICD-10-CM | POA: Insufficient documentation

## 2020-09-15 DIAGNOSIS — Z91040 Latex allergy status: Secondary | ICD-10-CM | POA: Insufficient documentation

## 2020-09-15 DIAGNOSIS — R52 Pain, unspecified: Secondary | ICD-10-CM

## 2020-09-15 DIAGNOSIS — Z87891 Personal history of nicotine dependence: Secondary | ICD-10-CM | POA: Insufficient documentation

## 2020-09-15 LAB — CBC WITH DIFFERENTIAL/PLATELET
Abs Immature Granulocytes: 0.02 10*3/uL (ref 0.00–0.07)
Basophils Absolute: 0 10*3/uL (ref 0.0–0.1)
Basophils Relative: 0 %
Eosinophils Absolute: 0.1 10*3/uL (ref 0.0–0.5)
Eosinophils Relative: 1 %
HCT: 39.3 % (ref 36.0–46.0)
Hemoglobin: 12.7 g/dL (ref 12.0–15.0)
Immature Granulocytes: 0 %
Lymphocytes Relative: 32 %
Lymphs Abs: 2.5 10*3/uL (ref 0.7–4.0)
MCH: 28.8 pg (ref 26.0–34.0)
MCHC: 32.3 g/dL (ref 30.0–36.0)
MCV: 89.1 fL (ref 80.0–100.0)
Monocytes Absolute: 0.7 10*3/uL (ref 0.1–1.0)
Monocytes Relative: 9 %
Neutro Abs: 4.5 10*3/uL (ref 1.7–7.7)
Neutrophils Relative %: 58 %
Platelets: 253 10*3/uL (ref 150–400)
RBC: 4.41 MIL/uL (ref 3.87–5.11)
RDW: 13 % (ref 11.5–15.5)
WBC: 7.8 10*3/uL (ref 4.0–10.5)
nRBC: 0 % (ref 0.0–0.2)

## 2020-09-15 LAB — COMPREHENSIVE METABOLIC PANEL
ALT: 13 U/L (ref 0–44)
AST: 14 U/L — ABNORMAL LOW (ref 15–41)
Albumin: 3.9 g/dL (ref 3.5–5.0)
Alkaline Phosphatase: 66 U/L (ref 38–126)
Anion gap: 5 (ref 5–15)
BUN: 10 mg/dL (ref 6–20)
CO2: 23 mmol/L (ref 22–32)
Calcium: 8.9 mg/dL (ref 8.9–10.3)
Chloride: 108 mmol/L (ref 98–111)
Creatinine, Ser: 0.66 mg/dL (ref 0.44–1.00)
GFR, Estimated: >60 mL/min (ref >60)
Glucose, Bld: 102 mg/dL — ABNORMAL HIGH (ref 70–99)
Potassium: 3.6 mmol/L (ref 3.5–5.1)
Sodium: 136 mmol/L (ref 135–145)
Total Bilirubin: 0.5 mg/dL (ref 0.3–1.2)
Total Protein: 7.6 g/dL (ref 6.5–8.1)

## 2020-09-15 LAB — ABO/RH: ABO/RH(D): O POS

## 2020-09-15 LAB — URINALYSIS, ROUTINE W REFLEX MICROSCOPIC
Bacteria, UA: NONE SEEN
Bilirubin Urine: NEGATIVE
Glucose, UA: NEGATIVE mg/dL
Ketones, ur: 20 mg/dL — AB
Nitrite: NEGATIVE
Protein, ur: NEGATIVE mg/dL
Specific Gravity, Urine: 1.021 (ref 1.005–1.030)
pH: 5 (ref 5.0–8.0)

## 2020-09-15 LAB — I-STAT BETA HCG BLOOD, ED (MC, WL, AP ONLY): I-stat hCG, quantitative: >2000 m[IU]/mL — ABNORMAL HIGH (ref <5)

## 2020-09-15 LAB — HCG, QUANTITATIVE, PREGNANCY: hCG, Beta Chain, Quant, S: 21140 m[IU]/mL — ABNORMAL HIGH (ref <5)

## 2020-09-15 LAB — LIPASE, BLOOD: Lipase: 25 U/L (ref 11–51)

## 2020-09-15 NOTE — ED Provider Notes (Signed)
[facility]-EMERGENCY DEPT Provider Note   CSN: [id number] Arrival date & time: 09/15/20  1031     History Chief Complaint  Patient presents with  . Abdominal Cramping    Jill GULIANA Morrow is a 40 y.o. female with a past medical history significant for chronic low back pain, history of ectopic pregnancy, fibromyalgia, and GERD who presents to the ED due to lower abdominal cramping that started earlier today. Patient states she took an at home pregnancy test last Tuesday which was positive. LMP 07/16/2018. She endorses passing "tissue" from her vagina two days ago. No vaginal bleeding or fluid. She admits to mild dysuria that started a couple days ago. Denies vaginal discharge. No concern for STDs. This is patient's 7th pregnancy with 3 living children. No treatment prior to arrival. No aggravating or alleviating symptoms.   History obtained from patient and past medical records. No interpreter used during encounter.      Past Medical History:  Diagnosis Date  . 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 Ectopic pregnancy   . Fibromyalgia    'had it before' no problems in 5-33yrs  . GERD (gastroesophageal reflux disease)   . Headache(784.0)   . Indigestion   . Infection    UTI  . Pregnancy induced hypertension    with first    Patient Active Problem List   Diagnosis Date Noted  . 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 Bilateral leg pain 02/16/2017  . Active labor 12/27/2013  . Encounter for trial of labor 12/27/2013  . NSVD (normal spontaneous vaginal delivery) 12/27/2013  . [redacted] weeks gestation of pregnancy 10/12/2013    Past Surgical History:  Procedure Laterality Date  . CARDIAC CATHETERIZATION     not a cardiac problem- really bad acid reflex  . CARDIAC CATHETERIZATION  2006  . CESAREAN SECTION    . CHOLECYSTECTOMY N/A 10/11/2013   Procedure: LAPAROSCOPIC CHOLECYSTECTOMY;  Surgeon: Shelly Rubenstein, MD;   Location: [facility];  Service: General;  Laterality: N/A;  . THERAPEUTIC ABORTION       OB History    Gravida  7   Para  3   Term  3   Preterm      AB  4   Living  3     SAB  2   IAB  1   Ectopic  1   Multiple      Live Births  3           Family History  Problem Relation Age of Onset  . Diabetes Maternal Aunt   . Cancer Maternal Aunt        mother's aunt Br Ca  . Diabetes Maternal Grandmother   . Hypertension Maternal Grandmother   . Heart disease Maternal Grandmother   . Other Neg Hx     Social History   Tobacco Use  . Smoking status: Former Games developer  . Smokeless tobacco: Never Used  . Tobacco comment: quit 2009  Substance Use Topics  . Alcohol use: No  . Drug use: No    Home Medications Prior to Admission medications   Medication Sig Start Date End Date Taking? Authorizing Provider  ALPRAZolam Prudy Feeler) 0.5 MG tablet Take 2 tablets approximately 45 minutes prior to the MRI study, take a third tablet if needed. 04/27/18   Dohmeier, Porfirio Mylar, MD  cetirizine (ZYRTEC) 10 MG tablet Take 10 mg by mouth daily as needed for allergies.    [provider]  gabapentin (NEURONTIN) 100 MG capsule Take 2 capsules (200 mg total) by mouth 3 (three) times daily. 05/11/18   York SpanielWillis, Charles K, MD  oxyCODONE-acetaminophen (PERCOCET) 5-325 MG tablet Take 1 tablet every 4 (four) hours as needed by mouth for severe pain. 03/14/17   Molpus, John, MD  predniSONE (DELTASONE) 5 MG tablet Begin taking 6 tablets daily, taper by one tablet daily until off the medication. 05/11/18   York SpanielWillis, Charles K, MD  sodium chloride (OCEAN) 0.65 % nasal spray Place 1 spray into the nose as needed for congestion. 06/02/11 07/17/11  Jimmie Mollyoll, Paolo, MD    Allergies    Aleve [naproxen sodium], Latex, and Tomato  Review of Systems   Review of Systems  Constitutional: Negative for chills and fever.  Respiratory: Negative for shortness of breath.   Cardiovascular: Negative for chest pain.   Gastrointestinal: Positive for abdominal pain. Negative for nausea and vomiting.  Genitourinary: Positive for dysuria. Negative for vaginal bleeding and vaginal discharge.  All other systems reviewed and are negative.   Physical Exam Updated Vital Signs BP 124/86   Pulse 79   Temp 99.3 F (37.4 C) (Oral)   Resp 17   LMP 07/15/2020   SpO2 98%   Physical Exam Vitals and nursing note reviewed.  Constitutional:      General: She is not in acute distress.    Appearance: She is not ill-appearing.  HENT:     Head: Normocephalic.  Eyes:     Pupils: Pupils are equal, round, and reactive to light.  Cardiovascular:     Rate and Rhythm: Normal rate and regular rhythm.     Pulses: Normal pulses.     Heart sounds: Normal heart sounds. No murmur heard. No friction rub. No gallop.   Pulmonary:     Effort: Pulmonary effort is normal.     Breath sounds: Normal breath sounds.  Abdominal:     General: Abdomen is flat. There is no distension.     Palpations: Abdomen is soft.     Tenderness: There is no abdominal tenderness. There is no guarding or rebound.     Comments: Abdomen soft, nondistended, nontender to palpation in all quadrants without guarding or peritoneal signs. No rebound.   Genitourinary:    Comments: deferred Musculoskeletal:        General: Normal range of motion.     Cervical back: Neck supple.  Skin:    General: Skin is warm and dry.  Neurological:     General: No focal deficit present.     Mental Status: She is alert.  Psychiatric:        Mood and Affect: Mood normal.        Behavior: Behavior normal.     ED Results / Procedures / Treatments   Labs (all labs ordered are listed, but only abnormal results are displayed) Labs Reviewed  COMPREHENSIVE METABOLIC PANEL - Abnormal; Notable for the following components:      Result Value   Glucose, Bld 102 (*)    AST 14 (*)    All other components within normal limits  URINALYSIS, ROUTINE W REFLEX MICROSCOPIC -  Abnormal; Notable for the following components:   Hgb urine dipstick MODERATE (*)    Ketones, ur 20 (*)    Leukocytes,Ua MODERATE (*)    All other components within normal limits  HCG, QUANTITATIVE, PREGNANCY - Abnormal; Notable for the following components:   hCG, Beta Chain, Quant, S 21,140 (*)    All  other components within normal limits  I-STAT BETA HCG BLOOD, ED ([facility], [facility], [facility] ONLY) - Abnormal; Notable for the following components:   I-stat hCG, quantitative >2,000.0 (*)    All other components within normal limits  URINE CULTURE  CBC WITH DIFFERENTIAL/PLATELET  LIPASE, BLOOD  ABO/RH    EKG None  Radiology US OB LESS THAN 14 WEEKS WITH OB TRANSVAGINAL  Result Date: 09/15/2020 CLINICAL DATA:  Cramping pelvic pain for 2 days. Positive pregnancy test. EXAM: OBSTETRIC <14 WK [place] AND TRANSVAGINAL OB US TECHNIQUE: Both transabdominal and transvaginal ultrasound examinations were performed for complete evaluation of the gestation as well as the maternal uterus, adnexal regions, and pelvic cul-de-sac. Transvaginal technique was performed to assess early pregnancy. COMPARISON:  None. FINDINGS: Intrauterine gestational sac: Present Yolk sac:  None Embryo:  None Cardiac Activity: N/A Heart Rate: N/A bpm MSD: 17 mm   6 w   5 d Subchorionic hemorrhage:  None visualized. Maternal uterus/adnexae: Both ovaries are normal. Small uterine fibroid is noted. No free pelvic fluid. IMPRESSION: 1. Empty intrauterine gestational sac estimated at 6 weeks and 5 days gestation. Findings could be due to on going spontaneous abortion or blighted ovum. Recommend correlation with clinical findings (vaginal bleeding) and serial quantitative beta HCG levels if indicated. 2. Normal ovaries. Electronically Signed   By: Rudie Meyer M.D.   On: 09/15/2020 13:21    Procedures Procedures   Medications Ordered in ED Medications - No data to display  ED Course  I have reviewed the triage vital signs and the nursing  notes.  Pertinent labs & imaging results that were available during my care of the patient were reviewed by me and considered in my medical decision making (see chart for details).  Clinical Course as of 09/15/20 1405  Mon Sep 15, 2020  1237 I-stat hCG, quantitative(!): >2,000.0 [CA]  1237 Hgb urine dipstickMarland Kitchen): MODERATE [CA]  1237 Ketones, ur(!): 20 [CA]  1237 Glori Luis): MODERATE [CA]    Clinical Course User Index [CA] Mannie Stabile, PA-C   MDM Rules/Calculators/A&P                         40 year old female presents to the ED due to abdominal cramping after passing "tissue" from her vagina 2 days ago in the setting of a positive at home pregnancy test. G7P3. Patient denies vaginal bleeding. Upon arrival, stable vitals. Patient in no acute distress. Abdomen soft, non-distended, non-tender. Low suspicion for acute abdomen. No CVA tenderness. Low suspicion for pyelonephritis. Labs and [place] ordered to evaluate for possible pregnancy vs. Ectopic vs. spontaneous abortion.   CBC unremarkable no leukocytosis and normal hemoglobin.  CMP reassuring with normal renal function no major electrolyte derangements.  Lipase normal at 25.  Doubt pancreatitis.  hCG elevated at 21,140.  UA significant for moderate hematuria, ketonuria, and leukocytes.  No nitrites.  No bacteria.  Urine culture pending. Will hold on antibiotics at this time after discussion with Dr. Estell Harpin who is agreeable to plan. [place] personally reviewed which demonstrates: IMPRESSION:  1. Empty intrauterine gestational sac estimated at 6 weeks and 5  days gestation. Findings could be due to on going spontaneous  abortion or blighted ovum. Recommend correlation with clinical  findings (vaginal bleeding) and serial quantitative beta HCG levels  if indicated.  2. Normal ovaries.   1:54 PM informed by RN that patient needs to leave to pick children up prior to ABO/rh lab. Patient advised to have repeat  hcg level drawn in 48 hours.  MAU number/location given to patient and advised her to report there in 48 hours to repeat test. OBGYN number given at discharge as well. Strict ED precautions discussed with patient. Patient states understanding and agrees to plan. Patient discharged home in no acute distress and stable vitals. Final Clinical Impression(s) / ED Diagnoses Final diagnoses:  Abdominal pain during pregnancy in first trimester    Rx / DC Orders ED Discharge Orders    None       Jesusita Oka 09/15/20 1407    Bethann Berkshire, MD 09/17/20 1006

## 2020-09-15 NOTE — Discharge Instructions (Addendum)
As discussed, your ultrasound was too early to tell if you had a miscarriage or early pregnancy. You will need your hormone level repeated in 48 hours. You can either go to [facility]U (at [facility]) or [facility]. Call [facility] today to see if you can schedule an appointment to repeat hcg in 48 hours or report to MAU. You may take tylenol as needed for pain. Your urine culture is pending. You will be called if it is positive for a UTI. Return to the ER for new or worsening symptoms.

## 2020-09-15 NOTE — ED Triage Notes (Signed)
Pt found out she was pregnant last week . She reports passing "tissue" 2 days ago and started cramping today. No blood. She has not established care with OB. Hx of miscarriages.

## 2020-09-15 NOTE — ED Provider Notes (Signed)
Emergency Medicine Provider Triage Evaluation Note  [name] , a 40 y.o. female  was evaluated in triage.  Pt complains of abdominal cramping x2 days.  Patient states she had a positive pregnancy test on last Tuesday.  Last menstrual cycle 07/05/2020. She notes she passed "tissue" 2 days ago. History of previous miscarriages and notes this feels similar. Denies vaginal bleeding. She also endorses dysuria.   Review of Systems  Positive: Dysuria, abdominal pain Negative: Vaginal bleeding  Physical Exam  BP (!) 143/82 (BP Location: Right Arm)   Pulse 82   Temp 99.3 F (37.4 C) (Oral)   Resp 18   LMP 07/15/2020   SpO2 100%  Gen:   Awake, no distress   Resp:  Normal effort  MSK:   Moves extremities without difficulty  Other:    Medical Decision Making  Medically screening exam initiated at 11:37 AM.  Appropriate orders placed.  [name] was informed that the remainder of the evaluation will be completed by another provider, this initial triage assessment does not replace that evaluation, and the importance of remaining in the ED until their evaluation is complete.  Abdominal pain with positive pregnancy test last week. Labs, pregnancy test, and [place] ordered.   Mannie Stabile, PA-C 09/15/20 1143    Bethann Berkshire, MD 09/17/20 1006

## 2020-09-16 LAB — URINE CULTURE

## 2020-09-17 ENCOUNTER — Inpatient Hospital Stay (HOSPITAL_COMMUNITY)
Admission: AD | Admit: 2020-09-17 | Discharge: 2020-09-17 | Disposition: A | Discharge status: Home / Self Care | Payer: PRIVATE HEALTH INSURANCE | Attending: Obstetrics & Gynecology | Admitting: Obstetrics & Gynecology

## 2020-09-17 ENCOUNTER — Encounter (HOSPITAL_COMMUNITY): Payer: Self-pay | Admitting: *Deleted

## 2020-09-17 DIAGNOSIS — Z79899 Other long term (current) drug therapy: Secondary | ICD-10-CM | POA: Insufficient documentation

## 2020-09-17 DIAGNOSIS — Z7952 Long term (current) use of systemic steroids: Secondary | ICD-10-CM | POA: Insufficient documentation

## 2020-09-17 DIAGNOSIS — O09529 Supervision of elderly multigravida, unspecified trimester: Secondary | ICD-10-CM | POA: Insufficient documentation

## 2020-09-17 DIAGNOSIS — R1033 Periumbilical pain: Secondary | ICD-10-CM | POA: Insufficient documentation

## 2020-09-17 DIAGNOSIS — Z3A00 Weeks of gestation of pregnancy not specified: Secondary | ICD-10-CM | POA: Insufficient documentation

## 2020-09-17 DIAGNOSIS — O3680X0 Pregnancy with inconclusive fetal viability, not applicable or unspecified: Secondary | ICD-10-CM

## 2020-09-17 DIAGNOSIS — Z87891 Personal history of nicotine dependence: Secondary | ICD-10-CM | POA: Insufficient documentation

## 2020-09-17 DIAGNOSIS — O26899 Other specified pregnancy related conditions, unspecified trimester: Secondary | ICD-10-CM | POA: Insufficient documentation

## 2020-09-17 LAB — HCG, QUANTITATIVE, PREGNANCY: hCG, Beta Chain, Quant, S: 15487 m[IU]/mL — ABNORMAL HIGH (ref <5)

## 2020-09-17 NOTE — MAU Note (Signed)
Was seen at [facility] 2 days ago.  Here for repeat hormone level. Denies any pain or bleeding today. No complaints.

## 2020-09-17 NOTE — Discharge Instructions (Signed)
Human Chorionic Gonadotropin Test Why am I having this test? A human chorionic gonadotropin (hCG) test is done to determine whether you are pregnant. It can also be used:  To diagnose an abnormal pregnancy.  To determine whether you have had a miscarriage or are at risk of one. What is being tested? This test checks the level of the human chorionic gonadotropin (hCG) hormone in the blood. This hormone is produced during pregnancy by the cells that form the placenta. The placenta is the organ that grows inside your uterus to nourish a developing baby. When you are pregnant, hCG can be detected in your blood or urine 7 to 8 days before your missed period. The amount of hCG continues to increase for the first 8-10 weeks of pregnancy. The presence of hCG in your blood can be measured with different types of tests. You may have:  A urine test. ? A urine test only shows whether there is hCG in your urine. It does not measure how much.  A qualitative blood test. ? This blood test only shows whether there is hCG in your blood. It does not measure how much.  A quantitative blood test. ? This type of blood test measures the amount of hCG in your blood. ? You may have this test to:  Diagnose an abnormal pregnancy.  Check whether you have had a miscarriage.  Determine whether you are at risk of a miscarriage.  Determine if treatment of an ectopic pregnancy is successful. What kind of sample is taken? Two kinds of samples may be collected to test for the hCG hormone.  Blood. It is usually collected by inserting a needle into a blood vessel.  Urine. It is usually collected by urinating into a germ-free (sterile) specimen cup.      How do I prepare for this test? No preparation is needed for a blood test.  Some preparation is needed for a urine test:  For best results, collect the sample the first time you urinate in the morning. That is when the concentration of hCG is highest.  Do not  drink too much fluid. Drink as you normally would, or as directed by your health care provider. Tell a health care provider about:  All medicines you are taking, including vitamins, herbs, eye drops, creams, and over-the-counter medicines.  Any blood in your urine. This may interfere with the result. How are the results reported? Depending on the type of test that you have, your test results may be reported as values. Your health care provider will compare your results to normal ranges that were established after testing a large group of people (reference ranges). Reference ranges may vary among labs and hospitals. For this test, common reference ranges that show absence of pregnancy are:  Quantitative hCG blood levels: less than 5 IU/L. Other results will be reported as either positive or negative. For this test, normal results (meaning the absence of pregnancy) are:  Negative for hCG in the urine test.  Negative for hCG in the qualitative blood test. What do the results mean? Urine and qualitative blood test  A negative result could mean: ? That you are not pregnant. ? That the test was done too early in your pregnancy to detect hCG in your blood or urine. If you still have other signs of pregnancy, the test will be repeated.  A positive result means: ? That you are most likely pregnant. Your health care provider may confirm your pregnancy with an ultrasound of   your uterus, if needed. Quantitative blood test Results of the quantitative hCG blood test will be reported as values. These values will be interpreted by your health care provider along with your medical history and symptoms you are experiencing. Results outside of expected ranges could mean that:  You are pregnant with twins.  You have abnormal growths in your uterus.  You have an ectopic pregnancy.  You may be experiencing a miscarriage. Talk with your health care provider about what your results mean. Questions to ask  your health care provider Ask your health care provider, or the department that is doing the test:  When will my results be ready?  How will I get my results?  What are my treatment options?  What other tests do I need?  What are my next steps? Summary  A human chorionic gonadotropin (hCG) test is done to determine whether you are pregnant.  When you are pregnant, hCG can be detected in your blood or urine 7 to 8 days before your missed period. HCG levels continue to go up for the first 8-10 weeks of pregnancy.  Your hCG level can be measured with different types of tests. You may have a urine test, a qualitative blood test, or a quantitative blood test.  Talk with your health care provider about what your test results mean. This information is not intended to replace advice given to you by your health care provider. Make sure you discuss any questions you have with your health care provider. Document Revised: 01/21/2020 Document Reviewed: 01/21/2020 Elsevier Patient Education  2021 Elsevier Inc.  

## 2020-09-17 NOTE — MAU Provider Note (Signed)
History     CSN: [id number]  Arrival date and time: 09/17/20 1118   Event Date/Time   First Provider Initiated Contact with Patient 09/17/20 1143      Chief Complaint  Patient presents with  . Follow-up        HPI [name] is a 40 y.o. [phone] with pregnancy of unknown location with presents to MAU for stat Quant hCG as advised by [facility]. Patient reports passing a "ball of tissue that was small and pinkish white" on 09/13/2020. Patient states this is how her previous miscarriages began so she planned to monitor her symptoms at home. She presented to [facility]16 for evaluation of new onset periumbilical cramping. At discharge from [facility] she was advised to report to MAU for stat repeat Quant hCG.  On arrival patient denies pain. She states she has not experienced bleeding at any point in this pregnancy. She denies abdominal tenderness, dysuria, fever or recent illness.   Patient does not have OB/GYN and would like to follow up with [facility].  OB History    Gravida  8   Para  3   Term  3   Preterm      AB  4   Living  3     SAB  2   IAB  1   Ectopic  1   Multiple      Live Births  3           Past Medical History:  Diagnosis Date  . 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 Ectopic pregnancy   . Fibromyalgia    'had it before' no problems in 5-60yrs  . GERD (gastroesophageal reflux disease)   . Headache(784.0)   . Indigestion   . Infection    UTI  . Pregnancy induced hypertension    with first    Past Surgical History:  Procedure Laterality Date  . CARDIAC CATHETERIZATION     not a cardiac problem- really bad acid reflex  . CARDIAC CATHETERIZATION  2006  . CESAREAN SECTION    . CHOLECYSTECTOMY N/A 10/11/2013   Procedure: LAPAROSCOPIC CHOLECYSTECTOMY;  Surgeon: Shelly Rubenstein, MD;  Location: [facility];  Service: General;  Laterality: N/A;  . THERAPEUTIC ABORTION      Family History  Problem Relation Age of Onset  .  Diabetes Maternal Aunt   . Cancer Maternal Aunt        mother's aunt Br Ca  . Diabetes Maternal Grandmother   . Hypertension Maternal Grandmother   . Heart disease Maternal Grandmother   . Other Neg Hx     Social History   Tobacco Use  . Smoking status: Former Games developer  . Smokeless tobacco: Never Used  . Tobacco comment: quit 2009  Substance Use Topics  . Alcohol use: No  . Drug use: No    Allergies:  Allergies  Allergen Reactions  . Aleve [Naproxen Sodium] Swelling and Rash    Swelling legs/feet; dizziness; hot, flushed  . Latex Hives, Itching and Rash  . Tomato Itching    Medications Prior to Admission  Medication Sig Dispense Refill Last Dose  . ALPRAZolam (XANAX) 0.5 MG tablet Take 2 tablets approximately 45 minutes prior to the MRI study, take a third tablet if needed. 3 tablet 0   . cetirizine (ZYRTEC) 10 MG tablet Take 10 mg by mouth daily as needed for allergies.     Marland Kitchen gabapentin (NEURONTIN) 100 MG capsule Take 2 capsules (200 mg  total) by mouth 3 (three) times daily. 90 capsule 3   . oxyCODONE-acetaminophen (PERCOCET) 5-325 MG tablet Take 1 tablet every 4 (four) hours as needed by mouth for severe pain. 12 tablet 0   . predniSONE (DELTASONE) 5 MG tablet Begin taking 6 tablets daily, taper by one tablet daily until off the medication. 21 tablet 0     Review of Systems  Gastrointestinal: Positive for abdominal pain.  All other systems reviewed and are negative.  Physical Exam   There were no vitals taken for this visit.  Physical Exam Vitals and nursing note reviewed. Exam conducted with a chaperone present.  Constitutional:      Appearance: She is obese. She is not ill-appearing.  Cardiovascular:     Rate and Rhythm: Normal rate and regular rhythm.     Pulses: Normal pulses.     Heart sounds: Normal heart sounds.  Pulmonary:     Effort: Pulmonary effort is normal.     Breath sounds: Normal breath sounds.  Abdominal:     Tenderness: There is no abdominal  tenderness.  Skin:    Capillary Refill: Capillary refill takes less than 2 seconds.  Neurological:     Mental Status: She is alert and oriented to person, place, and time.  Psychiatric:        Mood and Affect: Mood normal.        Behavior: Behavior normal.        Thought Content: Thought content normal.        Judgment: Judgment normal.     MAU Course  Procedures  --IUGS on imaging from [facility] 05/16 --Quant hCG 21,140 --Pregnancy of unknown location, decreasing quant hCG, patient asymptomatic during MAU evaluation --Discussed with Dr. Macon Large. Patient to have non-stat Quant hCG in one week --Ectopic precautions, bleeding precautions reviewed with patient by CNM  Patient Vitals for the past 24 hrs:  BP Temp Temp src Pulse Resp SpO2 Height Weight  09/17/20 1200 112/60 99.4 F (37.4 C) Oral 77 18 100 % 4\' 11"  (1.499 m) 98.2 kg   Results for orders placed or performed during the hospital encounter of 09/17/20 (from the past 24 hour(s))  hCG, quantitative, pregnancy     Status: Abnormal   Collection Time: 09/17/20 11:42 AM  Result Value Ref Range   hCG, Beta Chain, Quant, S 15,487 (H) <5 mIU/mL   Assessment and Plan  --40 y.o. 46 with pregnancy of unknown location --Decreasing quant hCG --Blood type O POS --Discharge home in stable condition  F/U: --In-basket message sent to [facility] to schedule non-stat Quant hCG in one week  [facility], CNM 09/17/2020, 6:08 PM

## 2020-09-22 ENCOUNTER — Other Ambulatory Visit: Payer: Self-pay | Admitting: General Practice

## 2020-09-22 DIAGNOSIS — O3680X0 Pregnancy with inconclusive fetal viability, not applicable or unspecified: Secondary | ICD-10-CM

## 2020-09-24 ENCOUNTER — Other Ambulatory Visit (INDEPENDENT_AMBULATORY_CARE_PROVIDER_SITE_OTHER): Payer: Self-pay

## 2020-09-24 ENCOUNTER — Other Ambulatory Visit: Payer: Self-pay

## 2020-09-24 DIAGNOSIS — O3680X0 Pregnancy with inconclusive fetal viability, not applicable or unspecified: Secondary | ICD-10-CM

## 2020-09-24 NOTE — Progress Notes (Signed)
Pt here today for non-stat Beta. Pt states is having light pink spotting since yesterday with mild cramps, denies any today. Pt states wanting to know when will have results and what the plan of care is. Pt advised results will be back in 1-2 days and will be contacted through phone or mychart message with plan of care once results are back. Pt agreeable and verbalized understanding.  Pt also concerned with finances. Pt states does not qualify for Medicaid. Will give [facility] application today.  Pt thankful for conversation and explanation of plan of care.   Judeth Cornfield, RN  09/24/20.

## 2020-09-24 NOTE — Progress Notes (Signed)
Patient seen and assessed by nursing staff.  Agree with documentation and plan.  

## 2020-09-25 ENCOUNTER — Telehealth: Payer: Self-pay | Admitting: Lactation Services

## 2020-09-25 LAB — BETA HCG QUANT (REF LAB): hCG Quant: 9299 m[IU]/mL

## 2020-09-25 NOTE — Telephone Encounter (Signed)
-----   Message from Calvert Cantor, [place] sent at 09/25/2020  6:42 AM EDT ----- Decreasing quant hCG in setting of concern for miscarriage. Please notify patient and schedule for weekly non-stat Quant hCGs until result is less than 5.

## 2020-09-25 NOTE — Telephone Encounter (Signed)
Called and informed patient that based on lab levels she is most likely having a miscarriage.    Patient is having cramping in abdomen, legs and back. She is having some maroon discharge off and on with wiping throughout the day. The pain has increased over the last days and increases with activity. She is resting as needed. Reviewed taking Tylenol for pain as needed.   Reviewed with patient if pain is severe or she is soaking more than one pad an hour for more than 2 hours she is to go back to MAU for evaluation. Reviewed in light of miscarriage she may experience bleeding like a period.   Patient informed she will need weekly HCG's until level is <5 and that it may take a few weeks to get that low.   Message to front office to call patient to schedule weekly non stat Hcg's for 3 weeks.

## 2020-09-29 ENCOUNTER — Inpatient Hospital Stay (HOSPITAL_COMMUNITY)
Admission: AD | Admit: 2020-09-29 | Discharge: 2020-09-29 | Disposition: A | Discharge status: Home / Self Care | Payer: Self-pay | Attending: Obstetrics and Gynecology | Admitting: Obstetrics and Gynecology

## 2020-09-29 ENCOUNTER — Encounter (HOSPITAL_COMMUNITY): Payer: Self-pay | Admitting: Obstetrics and Gynecology

## 2020-09-29 ENCOUNTER — Inpatient Hospital Stay (HOSPITAL_COMMUNITY): Payer: Self-pay

## 2020-09-29 ENCOUNTER — Other Ambulatory Visit: Payer: Self-pay

## 2020-09-29 DIAGNOSIS — Z6790 Unspecified blood type, Rh positive: Secondary | ICD-10-CM

## 2020-09-29 DIAGNOSIS — Z3A09 9 weeks gestation of pregnancy: Secondary | ICD-10-CM | POA: Insufficient documentation

## 2020-09-29 DIAGNOSIS — N96 Recurrent pregnancy loss: Secondary | ICD-10-CM | POA: Insufficient documentation

## 2020-09-29 DIAGNOSIS — O039 Complete or unspecified spontaneous abortion without complication: Secondary | ICD-10-CM | POA: Insufficient documentation

## 2020-09-29 DIAGNOSIS — O4690 Antepartum hemorrhage, unspecified, unspecified trimester: Secondary | ICD-10-CM

## 2020-09-29 DIAGNOSIS — Z886 Allergy status to analgesic agent status: Secondary | ICD-10-CM | POA: Insufficient documentation

## 2020-09-29 DIAGNOSIS — Z87891 Personal history of nicotine dependence: Secondary | ICD-10-CM | POA: Insufficient documentation

## 2020-09-29 DIAGNOSIS — O26891 Other specified pregnancy related conditions, first trimester: Secondary | ICD-10-CM | POA: Insufficient documentation

## 2020-09-29 LAB — CBC
HCT: 36.8 % (ref 36.0–46.0)
Hemoglobin: 11.9 g/dL — ABNORMAL LOW (ref 12.0–15.0)
MCH: 28.5 pg (ref 26.0–34.0)
MCHC: 32.3 g/dL (ref 30.0–36.0)
MCV: 88.2 fL (ref 80.0–100.0)
Platelets: 222 10*3/uL (ref 150–400)
RBC: 4.17 MIL/uL (ref 3.87–5.11)
RDW: 12.8 % (ref 11.5–15.5)
WBC: 9.2 10*3/uL (ref 4.0–10.5)
nRBC: 0 % (ref 0.0–0.2)

## 2020-09-29 LAB — HCG, QUANTITATIVE, PREGNANCY: hCG, Beta Chain, Quant, S: 9174 m[IU]/mL — ABNORMAL HIGH (ref <5)

## 2020-09-29 MED ORDER — ACETAMINOPHEN 500 MG PO TABS
1000.0000 mg | ORAL_TABLET | Freq: Once | ORAL | Status: AC
  Administered 2020-09-29: 1000 mg via ORAL
  Filled 2020-09-29: qty 2

## 2020-09-29 NOTE — MAU Note (Addendum)
...  [name] is a 40 y.o. at approximately [redacted]w[redacted]d according to LMP of 07/25/2020 in MAU reporting: dark red vaginal spotting that began yesterday morning that increased this morning at 0700. Patient states she put on a pad last night prior to going to bed and when she woke up the pad was full of blood and she passed several large clots. She endorses intermittent bilateral lower pelvic pain that she rates 7/10 that also began at 0700.  She states she has been being followed by her office for repeat hCG levels.

## 2020-09-29 NOTE — MAU Provider Note (Signed)
History     CSN: [id number]  Arrival date and time: 09/29/20 [id number]   Event Date/Time   First Provider Initiated Contact with Patient 09/29/20 [phone]      Chief Complaint  Patient presents with  . Abdominal Pain  . Vaginal Bleeding   Ms. [name] is a 40 y.o. [phone] at 3652w3d who presents to MAU for vaginal bleeding with known current miscarriage based on falling hCG. Patient reports she has had 4 miscarriages and knows what a miscarriage feels like, but this morning around 7AM reports she woke up to "contraction-like pain" which she did not experience with her previous miscarriages, so she came in to MAU for evaluation. Patient reports the contractions are coming every 1-2 minutes, but throughout the duration of a 10 minute interview with the provider only experienced one contraction. Patient reports the pain "happened so fast" that she did not have time to try any medication at home prior to coming to MAU for evaluation. Patient reports she was also told to come to the hospital if she "filled up an entire pad" which she reports she did overnight. Patient also shows provider a photo of something "clear" that she passed, which appears to be a gestational sac from the photo. Patient requesting something for pain.  Patient's daughter present for entire visit.  Pt denies vaginal discharge/odor/itching. Pt denies N/V, constipation, diarrhea, or urinary problems. Pt denies fever, chills, fatigue, sweating or changes in appetite. Pt denies SOB or chest pain. Pt denies dizziness, HA, light-headedness, weakness.  Allergies? Oxycodone, Aleve/NSAIDs, latex, tomatoe Current medications/supplements? none [facility] care [facility]/2020   OB History    Gravida  8   Para  3   Term  3   Preterm      AB  4   Living  3     SAB  2   IAB  1   Ectopic  1   Multiple      Live Births  3           Past Medical History:  Diagnosis Date  . Chronic low back pain  with bilateral sciatica 02/21/2017  . Ectopic pregnancy   . Fibromyalgia    'had it before' no problems in 5-2731yrs  . GERD (gastroesophageal reflux disease)   . Headache(784.0)   . Indigestion   . Infection    UTI  . Pregnancy induced hypertension    with first    Past Surgical History:  Procedure Laterality Date  . CARDIAC CATHETERIZATION     not a cardiac problem- really bad acid reflex  . CARDIAC CATHETERIZATION  2006  . CESAREAN SECTION    . CHOLECYSTECTOMY N/A 10/11/2013   Procedure: LAPAROSCOPIC CHOLECYSTECTOMY;  Surgeon: Shelly Rubensteinouglas A Blackman, MD;  Location: [facility];  Service: General;  Laterality: N/A;  . THERAPEUTIC ABORTION      Family History  Problem Relation Age of Onset  . Diabetes Maternal Aunt   . Cancer Maternal Aunt        mother's aunt Br Ca  . Diabetes Maternal Grandmother   . Hypertension Maternal Grandmother   . Heart disease Maternal Grandmother   . Other Neg Hx     Social History   Tobacco Use  . Smoking status: Former Games developermoker  . Smokeless tobacco: Never Used  . Tobacco comment: quit 2009  Vaping Use  . Vaping Use: Never used  Substance Use Topics  . Alcohol use: No  . Drug use: No  Allergies:  Allergies  Allergen Reactions  . Oxycodone Hives and Itching  . Aleve [Naproxen Sodium] Swelling and Rash    Swelling legs/feet; dizziness; hot, flushed, hives all over body  . Latex Hives, Itching and Rash  . Tomato Itching    No medications prior to admission.    Review of Systems  Constitutional: Negative for chills, diaphoresis, fatigue and fever.  Eyes: Negative for visual disturbance.  Respiratory: Negative for shortness of breath.   Cardiovascular: Negative for chest pain.  Gastrointestinal: Positive for abdominal pain (ctx). Negative for constipation, diarrhea, nausea and vomiting.  Genitourinary: Positive for vaginal bleeding. Negative for dysuria, flank pain, frequency, pelvic pain, urgency and vaginal discharge.  Neurological:  Negative for dizziness, weakness, light-headedness and headaches.   Physical Exam   Blood pressure 123/68, pulse 66, temperature 99 F (37.2 C), temperature source Oral, resp. rate 19, last menstrual period 07/25/2020, SpO2 99 %.  Patient Vitals for the past 24 hrs:  BP Temp Temp src Pulse Resp SpO2  09/29/20 1104 123/68 -- -- 66 -- 99 %  09/29/20 0903 136/84 -- -- 67 -- --  09/29/20 0838 135/74 99 F (37.2 C) Oral 70 19 100 %   Physical Exam Vitals and nursing note reviewed.  Constitutional:      General: She is not in acute distress.    Appearance: Normal appearance. She is not ill-appearing, toxic-appearing or diaphoretic.  HENT:     Head: Normocephalic and atraumatic.  Pulmonary:     Effort: Pulmonary effort is normal.  Neurological:     Mental Status: She is alert and oriented to person, place, and time.  Psychiatric:        Mood and Affect: Mood normal.        Behavior: Behavior normal.        Thought Content: Thought content normal.        Judgment: Judgment normal.    Results for orders placed or performed during the hospital encounter of 09/29/20 (from the past 24 hour(s))  CBC     Status: Abnormal   Collection Time: 09/29/20  9:29 AM  Result Value Ref Range   WBC 9.2 4.0 - 10.5 K/uL   RBC 4.17 3.87 - 5.11 MIL/uL   Hemoglobin 11.9 (L) 12.0 - 15.0 g/dL   HCT 92.1 19.4 - 17.4 %   MCV 88.2 80.0 - 100.0 fL   MCH 28.5 26.0 - 34.0 pg   MCHC 32.3 30.0 - 36.0 g/dL   RDW 08.1 44.8 - 18.5 %   Platelets 222 150 - 400 K/uL   nRBC 0.0 0.0 - 0.2 %  hCG, quantitative, pregnancy     Status: Abnormal   Collection Time: 09/29/20  9:29 AM  Result Value Ref Range   hCG, Beta Chain, Quant, S 9,174 (H) <5 mIU/mL    US OB Transvaginal  Result Date: 09/29/2020 CLINICAL DATA:  Vaginal bleeding and first-trimester pregnancy EXAM: TRANSVAGINAL OB ULTRASOUND TECHNIQUE: Transvaginal ultrasound was performed for complete evaluation of the gestation as well as the maternal uterus,  adnexal regions, and pelvic cul-de-sac. COMPARISON:  Fourteen days ago FINDINGS: Previously noted intrauterine gestational sac is no longer seen. The endometrial stripe is up to 7 mm in thickness and fairly homogeneous without focal hypervascularity. Partially calcified and shadowing intramural fibroid measuring 14 mm. Intra-ovarian ring like structure in the left ovary consistent with corpus luteum. IMPRESSION: Intrauterine sac on prior study is no longer seen. The endometrial stripe is 7 mm. Electronically Signed   By:  Marnee Spring M.D.   On: 09/29/2020 10:14   US OB LESS THAN 14 WEEKS WITH OB TRANSVAGINAL  Result Date: 09/15/2020 CLINICAL DATA:  Cramping pelvic pain for 2 days. Positive pregnancy test. EXAM: OBSTETRIC <14 WK [place] AND TRANSVAGINAL OB US TECHNIQUE: Both transabdominal and transvaginal ultrasound examinations were performed for complete evaluation of the gestation as well as the maternal uterus, adnexal regions, and pelvic cul-de-sac. Transvaginal technique was performed to assess early pregnancy. COMPARISON:  None. FINDINGS: Intrauterine gestational sac: Present Yolk sac:  None Embryo:  None Cardiac Activity: N/A Heart Rate: N/A bpm MSD: 17 mm   6 w   5 d Subchorionic hemorrhage:  None visualized. Maternal uterus/adnexae: Both ovaries are normal. Small uterine fibroid is noted. No free pelvic fluid. IMPRESSION: 1. Empty intrauterine gestational sac estimated at 6 weeks and 5 days gestation. Findings could be due to on going spontaneous abortion or blighted ovum. Recommend correlation with clinical findings (vaginal bleeding) and serial quantitative beta HCG levels if indicated. 2. Normal ovaries. Electronically Signed   By: Rudie Meyer M.D.   On: 09/15/2020 13:21   MAU Course  Procedures  MDM -VB and ctx-like pain in setting of known SAB -1000mg  Tylenol given for pain, pt allergic to NSAIDs/oxycodone, after administration pt reports pain now 0/10, was 7/10 -CBC: no abnormalities  requiring treatment - : previously noted intrauterine GS no longer seen, emdometrial strip up to 49mm in thickness and fairly homogenous without focal hypervascularity -hCG: 9,174 today (was 9,299 on 09/24/2020 in office, was 15,487 12d ago) -pad placed on patient on arrival to MAU, check 2 hours later shows small amount of bleeding on pad -ABO: O Positive -consulted with Dr. 09/26/2020, pt OK to be discharged home with f/u hCG in one week -pt discharged to home in stable condition  Orders Placed This Encounter  Procedures  . Jolayne Panther OB Transvaginal    Standing Status:   Standing    Number of Occurrences:   1    Order Specific Question:   Symptom/Reason for Exam    Answer:   Vaginal bleeding in pregnancy [[id number]]  . CBC    Standing Status:   Standing    Number of Occurrences:   1  . hCG, quantitative, pregnancy    Standing Status:   Standing    Number of Occurrences:   1  . Discharge patient    Order Specific Question:   Discharge disposition    Answer:   01-Home or Self Care [1]    Order Specific Question:   Discharge patient date    Answer:   09/29/2020   Meds ordered this encounter  Medications  . acetaminophen (TYLENOL) tablet 1,000 mg   Assessment and Plan   1. Miscarriage   2. Vaginal bleeding in pregnancy   3. Blood type, Rh positive    Allergies as of 09/29/2020      Reactions   Oxycodone Hives, Itching   Aleve [naproxen Sodium] Swelling, Rash   Swelling legs/feet; dizziness; hot, flushed, hives all over body   Latex Hives, Itching, Rash   Tomato Itching      Medication List    You have not been prescribed any medications.    -discussed bleeding precautions and when to return to MAU -discussed pain management in light of allergies to NSAIDs/oxycodone -pt aware will cancel hCG for 10/01/2020 and pt should present for hCG on 10/08/2020 as planned, message sent to [facility] to cancel appt -return MAU precautions given -pt discharged to home  in stable  condition  Odie Sera Ayrton Mcvay 09/29/2020, 11:24 AM

## 2020-09-29 NOTE — Discharge Instructions (Signed)
Miscarriage A miscarriage is the loss of pregnancy before the 20th week. Most miscarriages happen during the first 3 months of pregnancy. Sometimes, a miscarriage can happen before a woman knows that she is pregnant. Having a miscarriage can be an emotional experience. If you have had a miscarriage, talk with your health care provider about any questions you may have about the loss of your baby, the grieving process, and your plans for future pregnancy. What are the causes? Many times, the cause of a miscarriage is not known. What increases the risk? The following factors may make a pregnant woman more likely to have a miscarriage: Certain medical conditions  Conditions that affect the hormone balance in the body, such as thyroid disease or polycystic ovary syndrome.  Diabetes.  Autoimmune disorders.  Infections.  Bleeding disorders.  Obesity. Lifestyle factors  Using products with tobacco or nicotine in them or being exposed to tobacco smoke.  Having alcohol.  Having large amounts of caffeine.  Recreational drug use. Problems with reproductive organs or structures  Cervical insufficiency. This is when the lowest part of the uterus (cervix) opens and thins before pregnancy is at term.  Having a condition called Asherman syndrome. This syndrome causes scarring in the uterus or causes the uterus to be abnormal in structure.  Fibrous growths, called fibroids, in the uterus.  Congenital abnormalities. These problems are present at birth.  Infection of the cervix or uterus. Personal or medical history  Injury (trauma).  Having had a miscarriage before.  Being younger than age 18 or older than age 35.  Exposure to harmful substances in the environment. This may include radiation or heavy metals, such as lead.  Use of certain medicines. What are the signs or symptoms? Symptoms of this condition include:  Vaginal bleeding or spotting, with or without cramps or  pain.  Pain or cramping in the abdomen or lower back.  Fluid or tissue coming out of the vagina. How is this diagnosed? This condition may be diagnosed based on:  A physical exam.  Ultrasound.  Lab tests, such as blood tests, urine tests, or swabs for infection. How is this treated? Treatment for a miscarriage is sometimes not needed if all the pregnancy tissue that was in the uterus comes out on its own, and there are no other problems such as infection or heavy bleeding. In other cases, this condition may be treated with:  Dilation and curettage (D&C). In this procedure, the cervix is stretched open and any remaining pregnancy tissue is removed from the lining of the uterus (endometrium).  Medicines. These may include: ? Antibiotic medicine, to treat infection. ? Medicine to help any remaining pregnancy tissue come out of the body. ? Medicine to reduce (contract) the size of the uterus. These medicines may be given if there is a lot of bleeding. If you have Rh-negative blood, you may be given an injection of a medicine called Rho(D) immune globulin. This medicine helps prevent problems with future pregnancies. Follow these instructions at home: Medicines  Take over-the-counter and prescription medicines only as told by your health care provider.  If you were prescribed antibiotic medicine, take it as told by your health care provider. Do not stop taking the antibiotic even if you start to feel better. Activity  Rest as told by your health care provider. Ask your health care provider what activities are safe for you.  Have someone help with home and family responsibilities during this time. General instructions  Monitor how much tissue   or blood clot material comes out of the vagina.  Do not have sex, douche, or put anything, such as tampons, in your vagina until your health care provider says it is okay.  To help you and your partner with the grieving process, talk with your  health care provider or get counseling.  When you are ready, meet with your health care provider to discuss any important steps you should take for your health. Also, discuss steps you should take to have a healthy pregnancy in the future.  Keep all follow-up visits. This is important.   Where to find more information  The [employer] of Obstetricians and Gynecologists: acog.org  U.S. Department of Health and Programmer, systems of Women's Health: EverydayCosmetics.no Contact a health care provider if:  You have a fever or chills.  There is bad-smelling fluid coming from the vagina.  You have more bleeding instead of less.  Tissue or blood clots come out of your vagina. Get help right away if:  You have severe cramps or pain in your back or abdomen.  Heavy bleeding soaks through 2 large sanitary pads an hour for more than 2 hours.  You become light-headed or weak.  You faint.  You feel sad, and your sadness takes over your thoughts.  You think about hurting yourself. If you ever feel like you may hurt yourself or others, or have thoughts about taking your own life, get help right away. Go to your nearest emergency department or:  Call your local emergency services (911 in the [place]  Call a suicide crisis helpline, such as the [place] at 805-271-1892. This is open 24 hours a day in the [place]  Text the Crisis Text Line at 754 719 2970 (in the [place].). Summary  Most miscarriages happen in the first 3 months of pregnancy. Sometimes miscarriage happens before a woman knows that she is pregnant.  Follow instructions from your health care provider about medicines and activity.  To help you and your partner with grieving, talk with your health care provider or get counseling.  Keep all follow-up visits. This information is not intended to replace advice given to you by your health care provider. Make sure you discuss any questions you  have with your health care provider. Document Revised: 10/19/2019 Document Reviewed: 10/19/2019 Elsevier Patient Education  2021 [employer].         Managing Pregnancy Loss Pregnancy loss can happen any time during a pregnancy. Often the cause is not known. It is rarely because of anything you did. Pregnancy loss in early pregnancy (during the first trimester) is called a miscarriage. This type of pregnancy loss is the most common. Pregnancy loss that happens after 20 weeks of pregnancy is called fetal demise if the baby's heart stops beating before birth. Fetal demise is much less common. Some women experience spontaneous labor shortly after fetal demise resulting in a stillborn birth (stillbirth). Any pregnancy loss can be devastating. You will need to recover both physically and emotionally. Most women are able to get pregnant again after a pregnancy loss and deliver a healthy baby. How to manage emotional recovery Pregnancy loss is very hard emotionally. You may feel many different emotions while you grieve. You may feel sad and angry. You may also feel guilty. It is normal to have periods of crying. Emotional recovery can take longer than physical recovery. It is different for everyone. Taking these steps can help you in managing this loss:  Remember that it is  unlikely you did anything to cause the pregnancy loss.  Share your thoughts and feelings with friends, family, and your partner. Remember that your partner is also recovering emotionally.  Make sure you have a good support system. Do not spend too much time alone.  Meet with a pregnancy loss counselor or join a pregnancy loss support group.  Get enough sleep and eat a healthy diet. Return to regular exercise when you have recovered physically.  Do not use drugs or alcohol to manage your emotions.  Consider seeing a mental health professional to help you recover emotionally.  Ask a friend or loved one to help you decide  what to do with any clothing and nursery items you received for your baby. In the case of a stillbirth, many women benefit from taking additional steps in the grieving process. You may want to:  Hold your baby after the birth.  Name your baby.  Request a birth certificate.  Create a keepsake such as handprints or footprints.  Dress your baby and have a picture taken.  Make funeral arrangements.  Ask for a baptism or blessing. Hospitals have staff members who can help you with all these arrangements.   How to recognize emotional stress It is normal to have emotional stress after a pregnancy loss. But emotional stress that lasts a long time or becomes severe requires treatment. Watch out for these signs of severe emotional stress:  Sadness, anger, or guilt that is not going away and is interfering with your normal activities.  Relationship problems that have occurred or gotten worse since the pregnancy loss.  Signs of depression that last longer than 2 weeks. These may include: ? Sadness. ? Anxiety. ? Hopelessness. ? Loss of interest in activities you enjoy. ? Inability to concentrate. ? Trouble sleeping or sleeping too much. ? Loss of appetite or overeating. ? Thoughts of death or of hurting yourself. Follow these instructions at home:  Take over-the-counter and prescription medicines only as told by your health care provider.  Rest at home until your energy level returns. Return to your normal activities as told by your health care provider. Ask your health care provider what activities are safe for you.  When you are ready, meet with your health care provider to discuss steps to take for a future pregnancy.  Keep all follow-up visits as told by your health care provider. This is important. Where to find support  To help you and your partner with the process of grieving, talk with your health care provider or seek counseling.  Consider meeting with others who have  experienced pregnancy loss. Ask your health care provider about support groups and resources. Where to find more information  U.S. Department of Health and Programmer, systems on Women's Health: [place]s.tn  American Pregnancy Association: www.americanpregnancy.org Contact a health care provider if:  You continue to experience grief, sadness, or lack of motivation for everyday activities, and those feelings do not improve over time.  You are struggling to recover emotionally, especially if you are using alcohol or substances to help. Get help right away if:  You have thoughts of hurting yourself or others. If you ever feel like you may hurt yourself or others, or have thoughts about taking your own life, get help right away. You can go to your nearest emergency department or call:  Your local emergency services (911 in the [place]  A suicide crisis helpline, such as the [place] at 414-184-1898. This is open 24  hours a day. Summary  Any pregnancy loss can be difficult physically and emotionally.  You may experience many different emotions while you grieve. Emotional recovery can last longer than physical recovery.  It is normal to have emotional stress after a pregnancy loss. But emotional stress that lasts a long time or becomes severe requires treatment.  See your health care provider if you are struggling emotionally after a pregnancy loss. This information is not intended to replace advice given to you by your health care provider. Make sure you discuss any questions you have with your health care provider. Document Revised: 08/09/2018 Document Reviewed: 06/30/2017 Elsevier Patient Education  2021 [employer].

## 2020-10-01 ENCOUNTER — Other Ambulatory Visit: Payer: Self-pay

## 2020-10-01 DIAGNOSIS — O039 Complete or unspecified spontaneous abortion without complication: Secondary | ICD-10-CM

## 2020-10-01 NOTE — Addendum Note (Signed)
Addended by: Marjo Bicker on: 10/01/2020 09:35 AM   Modules accepted: Orders

## 2020-10-01 NOTE — Progress Notes (Signed)
Message received from Nugent, NP requesting beta HCG lab be discontinued and lab appt cancelled. States that this is not needed and pt is aware. Front office notified to cancel appt.

## 2020-10-07 ENCOUNTER — Other Ambulatory Visit: Payer: Self-pay | Admitting: General Practice

## 2020-10-07 DIAGNOSIS — O039 Complete or unspecified spontaneous abortion without complication: Secondary | ICD-10-CM

## 2020-10-08 ENCOUNTER — Other Ambulatory Visit: Payer: Self-pay

## 2020-10-08 ENCOUNTER — Other Ambulatory Visit: Payer: Self-pay | Admitting: Women's Health

## 2020-10-08 DIAGNOSIS — O039 Complete or unspecified spontaneous abortion without complication: Secondary | ICD-10-CM

## 2020-10-09 LAB — BETA HCG QUANT (REF LAB): hCG Quant: 191 m[IU]/mL

## 2020-10-13 NOTE — Progress Notes (Signed)
Please call pt to let her know Sharene Butters is dropping appropriately, needs repeat hCG on Wednesday again and needs to follow to 0. Please put future hCG results under in-office provider.  Thank you, Joni Reining

## 2020-10-14 ENCOUNTER — Telehealth: Payer: Self-pay | Admitting: Lactation Services

## 2020-10-14 ENCOUNTER — Other Ambulatory Visit: Payer: Self-pay

## 2020-10-14 DIAGNOSIS — O039 Complete or unspecified spontaneous abortion without complication: Secondary | ICD-10-CM

## 2020-10-14 NOTE — Telephone Encounter (Signed)
Called patient to inform her of results of decreasing Hcg and need for follow up levels tomorrow.   Patient did not answer. LM for patient to call the office for results and recommendations.   My Chart message sent.

## 2020-10-14 NOTE — Telephone Encounter (Signed)
-----   Message from Marylen Ponto, NP sent at 10/13/2020  4:05 PM EDT ----- Please call pt to let her know quant is dropping appropriately, needs repeat hCG on Wednesday again and needs to follow to 0. Please put future hCG results under in-office provider.  Thank you, Joni Reining

## 2020-10-14 NOTE — Progress Notes (Signed)
beta

## 2020-10-15 ENCOUNTER — Other Ambulatory Visit: Payer: Self-pay

## 2020-10-15 DIAGNOSIS — O039 Complete or unspecified spontaneous abortion without complication: Secondary | ICD-10-CM

## 2020-10-16 LAB — BETA HCG QUANT (REF LAB): hCG Quant: 26 m[IU]/mL

## 2020-10-20 ENCOUNTER — Other Ambulatory Visit: Payer: Self-pay

## 2020-10-20 DIAGNOSIS — O039 Complete or unspecified spontaneous abortion without complication: Secondary | ICD-10-CM

## 2020-10-22 ENCOUNTER — Other Ambulatory Visit: Payer: Self-pay

## 2020-10-22 DIAGNOSIS — O039 Complete or unspecified spontaneous abortion without complication: Secondary | ICD-10-CM

## 2020-10-23 LAB — BETA HCG QUANT (REF LAB): hCG Quant: 5 m[IU]/mL

## 2020-10-29 ENCOUNTER — Other Ambulatory Visit: Payer: Self-pay

## 2023-04-05 ENCOUNTER — Other Ambulatory Visit: Payer: Self-pay

## 2023-04-05 ENCOUNTER — Emergency Department (HOSPITAL_COMMUNITY)
Admission: EM | Admit: 2023-04-05 | Discharge: 2023-04-05 | Disposition: A | Discharge status: Home / Self Care | Payer: Self-pay | Attending: Emergency Medicine | Admitting: Emergency Medicine

## 2023-04-05 ENCOUNTER — Encounter (HOSPITAL_COMMUNITY): Payer: Self-pay

## 2023-04-05 DIAGNOSIS — R55 Syncope and collapse: Secondary | ICD-10-CM | POA: Insufficient documentation

## 2023-04-05 DIAGNOSIS — Z1152 Encounter for screening for COVID-19: Secondary | ICD-10-CM | POA: Insufficient documentation

## 2023-04-05 DIAGNOSIS — Z91040 Latex allergy status: Secondary | ICD-10-CM | POA: Insufficient documentation

## 2023-04-05 DIAGNOSIS — R110 Nausea: Secondary | ICD-10-CM | POA: Insufficient documentation

## 2023-04-05 DIAGNOSIS — R519 Headache, unspecified: Secondary | ICD-10-CM | POA: Insufficient documentation

## 2023-04-05 LAB — RESP PANEL BY RT-PCR (RSV, FLU A&B, COVID)  RVPGX2
Influenza A by PCR: NEGATIVE
Influenza B by PCR: NEGATIVE
Resp Syncytial Virus by PCR: NEGATIVE
SARS Coronavirus 2 by RT PCR: NEGATIVE

## 2023-04-05 LAB — BASIC METABOLIC PANEL
Anion gap: 5 (ref 5–15)
BUN: 12 mg/dL (ref 6–20)
CO2: 23 mmol/L (ref 22–32)
Calcium: 8.6 mg/dL — ABNORMAL LOW (ref 8.9–10.3)
Chloride: 104 mmol/L (ref 98–111)
Creatinine, Ser: 0.73 mg/dL (ref 0.44–1.00)
GFR, Estimated: >60 mL/min (ref >60)
Glucose, Bld: 112 mg/dL — ABNORMAL HIGH (ref 70–99)
Potassium: 3.5 mmol/L (ref 3.5–5.1)
Sodium: 132 mmol/L — ABNORMAL LOW (ref 135–145)

## 2023-04-05 LAB — URINALYSIS, ROUTINE W REFLEX MICROSCOPIC
Bilirubin Urine: NEGATIVE
Glucose, UA: NEGATIVE mg/dL
Hgb urine dipstick: NEGATIVE
Ketones, ur: NEGATIVE mg/dL
Leukocytes,Ua: NEGATIVE
Nitrite: NEGATIVE
Protein, ur: NEGATIVE mg/dL
Specific Gravity, Urine: 1.006 (ref 1.005–1.030)
pH: 5 (ref 5.0–8.0)

## 2023-04-05 LAB — TROPONIN I (HIGH SENSITIVITY): Troponin I (High Sensitivity): <2 ng/L (ref <18)

## 2023-04-05 LAB — CBC
HCT: 39.5 % (ref 36.0–46.0)
Hemoglobin: 12.9 g/dL (ref 12.0–15.0)
MCH: 29.3 pg (ref 26.0–34.0)
MCHC: 32.7 g/dL (ref 30.0–36.0)
MCV: 89.6 fL (ref 80.0–100.0)
Platelets: 236 10*3/uL (ref 150–400)
RBC: 4.41 MIL/uL (ref 3.87–5.11)
RDW: 13.1 % (ref 11.5–15.5)
WBC: 8 10*3/uL (ref 4.0–10.5)
nRBC: 0 % (ref 0.0–0.2)

## 2023-04-05 LAB — CBG MONITORING, ED: Glucose-Capillary: 114 mg/dL — ABNORMAL HIGH (ref 70–99)

## 2023-04-05 LAB — HCG, SERUM, QUALITATIVE: Preg, Serum: NEGATIVE

## 2023-04-05 MED ORDER — LACTATED RINGERS IV BOLUS
1000.0000 mL | Freq: Once | INTRAVENOUS | Status: AC
Start: 2023-04-05 — End: 2023-04-05
  Administered 2023-04-05: 1000 mL via INTRAVENOUS

## 2023-04-05 MED ORDER — HYDROCODONE-ACETAMINOPHEN 5-325 MG PO TABS
1.0000 | ORAL_TABLET | Freq: Once | ORAL | Status: AC
  Administered 2023-04-05: 1 via ORAL
  Filled 2023-04-05: qty 1

## 2023-04-05 MED ORDER — ACETAMINOPHEN 325 MG PO TABS
650.0000 mg | ORAL_TABLET | Freq: Once | ORAL | Status: AC
  Administered 2023-04-05: 650 mg via ORAL
  Filled 2023-04-05: qty 2

## 2023-04-05 NOTE — ED Provider Notes (Signed)
[place] EMERGENCY DEPARTMENT AT [facility] Provider Note   CSN: [id number] Arrival date & time: 04/05/23  1152     History  Chief Complaint  Patient presents with   Headache    [name] is a 42 y.o. female.   Headache    Patient has a history of acid reflux, prior headaches, fibromyalgia, chronic back pain.  Patient states over the last couple days that she has been having trouble with feeling nauseated and near syncopal.  Patient states she noticed any very hungry and fatigued.  She is feeling lightheaded as if she was going to pass out.  Patient states her symptoms got a little better after she ate something.  However her symptoms have persisted.  She also has mild headache.  She is not having any vomiting.  No abdominal pain.  She has had some mild chest discomfort.  Home Medications Prior to Admission medications   Medication Sig Start Date End Date Taking? Authorizing Provider  sodium chloride (OCEAN) 0.65 % nasal spray Place 1 spray into the nose as needed for congestion. 06/02/11 07/17/11  Jimmie Molly, MD      Allergies    Oxycodone, Aleve [naproxen sodium], Latex, and Tomato    Review of Systems   Review of Systems  Neurological:  Positive for headaches.    Physical Exam Updated Vital Signs BP (!) 115/57   Pulse 78   Temp 98.7 F (37.1 C) (Oral)   Resp 17   Ht 1.499 m (4\' 11" )   Wt 96.2 kg   SpO2 99%   BMI 42.82 kg/m  Physical Exam Vitals and nursing note reviewed.  Constitutional:      General: She is not in acute distress.    Appearance: She is well-developed.  HENT:     Head: Normocephalic and atraumatic.     Right Ear: External ear normal.     Left Ear: External ear normal.     Mouth/Throat:     Mouth: Mucous membranes are moist.  Eyes:     General: No scleral icterus.       Right eye: No discharge.        Left eye: No discharge.     Conjunctiva/sclera: Conjunctivae normal.  Neck:     Trachea: No tracheal deviation.   Cardiovascular:     Rate and Rhythm: Normal rate and regular rhythm.  Pulmonary:     Effort: Pulmonary effort is normal. No respiratory distress.     Breath sounds: Normal breath sounds. No stridor. No wheezing or rales.  Abdominal:     General: Bowel sounds are normal. There is no distension.     Palpations: Abdomen is soft.     Tenderness: There is no abdominal tenderness. There is no guarding or rebound.  Musculoskeletal:        General: No tenderness or deformity.     Cervical back: Normal range of motion and neck supple. No rigidity.  Skin:    General: Skin is warm and dry.     Findings: No rash.  Neurological:     General: No focal deficit present.     Mental Status: She is alert.     Cranial Nerves: No cranial nerve deficit, dysarthria or facial asymmetry.     Sensory: No sensory deficit.     Motor: No abnormal muscle tone or seizure activity.     Coordination: Coordination normal.  Psychiatric:        Mood and Affect: Mood normal.  ED Results / Procedures / Treatments   Labs (all labs ordered are listed, but only abnormal results are displayed) Labs Reviewed  BASIC METABOLIC PANEL - Abnormal; Notable for the following components:      Result Value   Sodium 132 (*)    Glucose, Bld 112 (*)    Calcium 8.6 (*)    All other components within normal limits  URINALYSIS, ROUTINE W REFLEX MICROSCOPIC - Abnormal; Notable for the following components:   Color, Urine STRAW (*)    All other components within normal limits  CBG MONITORING, ED - Abnormal; Notable for the following components:   Glucose-Capillary 114 (*)    All other components within normal limits  RESP PANEL BY RT-PCR (RSV, FLU A&B, COVID)  RVPGX2  CBC  HCG, SERUM, QUALITATIVE  TROPONIN I (HIGH SENSITIVITY)    EKG None  Radiology No results found.  Procedures Procedures    Medications Ordered in ED Medications  HYDROcodone-acetaminophen (NORCO/VICODIN) 5-325 MG per tablet 1 tablet (has no  administration in time range)  acetaminophen (TYLENOL) tablet 650 mg (650 mg Oral Given 04/05/23 1355)  lactated ringers bolus 1,000 mL (1,000 mLs Intravenous New Bag/Given 04/05/23 1355)    ED Course/ Medical Decision Making/ A&P Clinical Course as of 04/05/23 1509  Tue Apr 05, 2023  1345 CBC normal.  Pregnancy test negative.  COVID flu RSV negative urinalysis negative [JK]    Clinical Course User Index [JK] Linwood Dibbles, MD                                 Medical Decision Making Problems Addressed: Near syncope: acute illness or injury that poses a threat to life or bodily functions Nonintractable headache, unspecified chronicity pattern, unspecified headache type: acute illness or injury that poses a threat to life or bodily functions  Amount and/or Complexity of Data Reviewed Labs: ordered.  Risk OTC drugs. Prescription drug management.   Patient presented to the ER for evaluation of headache fatigue nausea dizziness cough.  Patient also has experienced lightheaded symptoms where she felt like she might pass out.  In the ED patient did not have any syncope.  She has normal vital signs.  No signs of hypotension or tachydysrhythmia.  Labs without signs of dehydration or anemia.  No significant electrolyte abnormalities.  COVID flu RSV negative. Lungs are clear to auscultation.  No crackles or wheezes.  No fever or leukocytosis.  I doubt pneumonia.  Evaluation and diagnostic testing in the emergency department does not suggest an emergent condition requiring admission or immediate intervention beyond what has been performed at this time.  The patient is safe for discharge and has been instructed to return immediately for worsening symptoms, change in symptoms or any other concerns.        Final Clinical Impression(s) / ED Diagnoses Final diagnoses:  Near syncope  Nonintractable headache, unspecified chronicity pattern, unspecified headache type    Rx / DC Orders ED Discharge  Orders     None         Linwood Dibbles, MD 04/05/23 1510

## 2023-04-05 NOTE — Discharge Instructions (Signed)
The test today in the ED were reassuring.  Follow-up with a primary care doctor to be rechecked if the symptoms have not completely resolved in the next week.  Return to the ER for worsening symptoms, weakness, high fevers or other concerns

## 2023-04-05 NOTE — ED Triage Notes (Signed)
C/o headache, fatigue, nausea, dizziness, cough, and lightheaded x1 day.  Pt ambulatory to triage.
